# Patient Record
Sex: Female | Born: 2000 | Race: White | Hispanic: No | Marital: Single | Smoking: Never smoker
Health system: Southern US, Community
[De-identification: ages and names within clinical notes are randomized; demographics above are authoritative.]

## PROBLEM LIST (undated history)

## (undated) ENCOUNTER — Inpatient Hospital Stay (HOSPITAL_COMMUNITY): Payer: Self-pay

## (undated) DIAGNOSIS — F419 Anxiety disorder, unspecified: Secondary | ICD-10-CM

## (undated) DIAGNOSIS — E559 Vitamin D deficiency, unspecified: Secondary | ICD-10-CM

## (undated) DIAGNOSIS — F988 Other specified behavioral and emotional disorders with onset usually occurring in childhood and adolescence: Secondary | ICD-10-CM

## (undated) DIAGNOSIS — F32A Depression, unspecified: Secondary | ICD-10-CM

## (undated) DIAGNOSIS — K219 Gastro-esophageal reflux disease without esophagitis: Secondary | ICD-10-CM

## (undated) HISTORY — PX: NO PAST SURGERIES: SHX2092

## (undated) HISTORY — DX: Other specified behavioral and emotional disorders with onset usually occurring in childhood and adolescence: F98.8

## (undated) HISTORY — DX: Vitamin D deficiency, unspecified: E55.9

## (undated) HISTORY — DX: Gastro-esophageal reflux disease without esophagitis: K21.9

## (undated) HISTORY — PX: WISDOM TOOTH EXTRACTION: SHX21

## (undated) HISTORY — DX: Anxiety disorder, unspecified: F41.9

## (undated) HISTORY — DX: Depression, unspecified: F32.A

---
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
## 2022-01-31 ENCOUNTER — Other Ambulatory Visit: Payer: Medicaid Other

## 2022-01-31 ENCOUNTER — Ambulatory Visit: Admit: 2022-01-31 | Payer: Medicaid Other | Admitting: Obstetrics and Gynecology

## 2022-01-31 SURGERY — LAPAROSCOPY, DIAGNOSTIC
Anesthesia: Choice

## 2022-02-02 ENCOUNTER — Other Ambulatory Visit: Payer: Medicaid Other

## 2022-02-07 ENCOUNTER — Ambulatory Visit: Admission: RE | Admit: 2022-02-07 | Payer: Medicaid Other | Source: Ambulatory Visit

## 2022-02-10 ENCOUNTER — Ambulatory Visit: Payer: Medicaid Other

## 2022-02-10 ENCOUNTER — Ambulatory Visit: Payer: Medicaid Other | Admitting: Obstetrics & Gynecology

## 2022-02-14 ENCOUNTER — Ambulatory Visit: Payer: Medicaid Other | Admitting: Nurse Practitioner

## 2022-02-15 ENCOUNTER — Ambulatory Visit: Payer: Medicaid Other | Admitting: Nurse Practitioner

## 2022-02-16 ENCOUNTER — Ambulatory Visit: Payer: Medicaid Other

## 2022-03-07 ENCOUNTER — Ambulatory Visit: Payer: Medicaid Other | Admitting: Obstetrics & Gynecology

## 2022-03-13 ENCOUNTER — Ambulatory Visit: Payer: Medicaid Other

## 2022-03-14 ENCOUNTER — Ambulatory Visit: Payer: Medicaid Other | Admitting: Obstetrics and Gynecology

## 2022-03-16 ENCOUNTER — Ambulatory Visit (HOSPITAL_BASED_OUTPATIENT_CLINIC_OR_DEPARTMENT_OTHER)
Admission: RE | Admit: 2022-03-16 | Discharge: 2022-03-16 | Disposition: A | Discharge status: Home / Self Care | Payer: Medicaid Other | Source: Ambulatory Visit | Attending: Obstetrics and Gynecology | Admitting: Obstetrics and Gynecology

## 2022-03-16 DIAGNOSIS — N83202 Unspecified ovarian cyst, left side: Secondary | ICD-10-CM | POA: Insufficient documentation

## 2022-03-21 ENCOUNTER — Ambulatory Visit: Payer: Medicaid Other | Admitting: Obstetrics and Gynecology

## 2022-03-21 ENCOUNTER — Ambulatory Visit: Payer: Medicaid Other | Admitting: Nurse Practitioner

## 2022-03-23 ENCOUNTER — Ambulatory Visit: Payer: Medicaid Other | Admitting: Obstetrics and Gynecology

## 2022-03-27 ENCOUNTER — Ambulatory Visit: Payer: Medicaid Other | Admitting: Obstetrics and Gynecology

## 2022-03-27 ENCOUNTER — Ambulatory Visit (INDEPENDENT_AMBULATORY_CARE_PROVIDER_SITE_OTHER): Payer: Medicaid Other | Admitting: Obstetrics and Gynecology

## 2022-03-27 ENCOUNTER — Encounter: Payer: Self-pay | Admitting: Obstetrics and Gynecology

## 2022-03-27 VITALS — BP 116/78 | HR 82 | Resp 16 | Ht 65.0 in | Wt 207.0 lb

## 2022-03-27 DIAGNOSIS — N83202 Unspecified ovarian cyst, left side: Secondary | ICD-10-CM

## 2022-03-27 NOTE — Progress Notes (Signed)
   RETURN GYNECOLOGY VISIT  Subjective:  Anita Caldwell is a 22 y.o. G0 with LMP 02/28/2022 presenting for follow up of ovarian cysts.   First diagnosed 06/2021 - p/w abd/pelvic pain, [place] w/ 5.3 x 2.3 x 3.1cm complex cyst vs tuboovarian collection. Planned surveillance  Repeat US 2 weeks later w/ 4.5 x 4.3 x 4.2 complex cyst on R c/f hemorrhagic cyst and 4.5 x 3.4 x 4.3 cm complex cyst w/ internal thin septations. Seen by Dr. Damita Dunnings who offered surveillance vs surgical management.  Opted for surveillance  Seen again 8/3 - repeat US showed resolution of cyst on right, but interval increase in size of complex cyst on left w/ new possibility of hydrosalpinx & additional 2.9cm complex cyst c/f hemorrhagic cyst vs endometrioma. Was treated with doxycycline w/ plan for MRI & diagnostic laparoscopy.   MRI on 8/20 w/ 5.3 cm complex hemorrhagic cystic lesion in the left ovary and mild left hydrosalpinx, without significant change since previous ultrasound - ddx endometrioma, neoplasm. Also showed 1.0 cm hemorrhagic cyst or endometrioma in the right ovary. Was planned for dx lsc, ovarian cystectomy, and chromotubation, but saw Dr. Elly Modena for second opinion. Ultimately decided on surveillance and deferring surgery.   TODAY, she presents for [place] follow up. She reports occasional abdominal pain/cramping that she manages with OTC medications without issue. She had some pain with transvaginal component of [place] when performed. Pain is not affecting her day-to-day life. She desires fertility in the future but is not current TTC. Her pelvic US show a 3.1cm complex cyst in the left adnexa with no internal septations/nodules c/f hemorrhagic cyst. Her prior L sided cyst has resolved.   I personally reviewed the notes from Dr. Damita Dunnings on 07/05/21, 10/06/21 and 10/26/21, notes from Dr. Elly Modena on 12/27/21, as well as all of the imaging studies noted above.  Objective:   Vitals:   03/27/22 1105  BP: 116/78  Pulse: 82  Resp:  16  Weight: 207 lb (93.9 kg)  Height: 5\' 5"  (1.651 m)    General:  Alert, oriented and cooperative. Patient is in no acute distress.  Skin: Skin is warm and dry. No rash noted.   Cardiovascular: Normal heart rate noted  Respiratory: Normal respiratory effort, no problems with respiration noted   Assessment and Plan:  Anita Caldwell is a 22 y.o. with suspected left hemorrhagic cyst  Cyst of left ovary Discussed suspected hemorrhagic cyst vs endometrioma and that previously visualized cyst had resolved. Discussed etiology of hemorrhagic cyst. Given that she is asymptomatic, surgical intervention is not indicated at this time. Discussed that endometrioma can have an affect on fertility, but that further evaluation can be performed was she starts trying to conceive.  Discussed cyst suppression - reviewed theoretical benefit & options. She is considering and would like to f/u in the next few weeks to discuss contraception. Discussed that we can follow up cyst prn in 6-12 months   Return in about 2 months (around 05/26/2022).  Future Appointments  Date Time Provider [place]  05/08/2022  8:10 AM Inez Catalina, MD [facility]   Inez Catalina, MD

## 2022-05-08 ENCOUNTER — Ambulatory Visit: Payer: Medicaid Other | Admitting: Obstetrics and Gynecology

## 2022-05-10 ENCOUNTER — Ambulatory Visit (INDEPENDENT_AMBULATORY_CARE_PROVIDER_SITE_OTHER): Payer: Medicaid Other | Admitting: Nurse Practitioner

## 2022-05-10 ENCOUNTER — Encounter: Payer: Self-pay | Admitting: Nurse Practitioner

## 2022-05-10 VITALS — BP 109/67 | HR 77 | Ht 65.0 in | Wt 207.0 lb

## 2022-05-10 DIAGNOSIS — Z3201 Encounter for pregnancy test, result positive: Secondary | ICD-10-CM | POA: Diagnosis not present

## 2022-05-10 DIAGNOSIS — N926 Irregular menstruation, unspecified: Secondary | ICD-10-CM

## 2022-05-10 LAB — POCT URINE PREGNANCY: Preg Test, Ur: POSITIVE — AB

## 2022-05-10 NOTE — Progress Notes (Signed)
Established patient visit   Patient: Anita Caldwell   DOB: [birth year]   22 y.o. Female  MRN: [id number] Visit Date: 05/10/2022   Chief Complaint  Patient presents with   Amenorrhea   Subjective    HPI  Patient has missed per menstrual period.  -has taken several home pregnancy tests which have all been positive.  -she has had some nausea  -no other symptoms are noted at this time.     Medications: No outpatient medications prior to visit.   No facility-administered medications prior to visit.    Review of Systems See HPI    Last CBC Lab Results  Component Value Date   WBC 5.3 05/29/2022   HGB 13.4 05/29/2022   HCT 42.2 05/29/2022   MCV 84 05/29/2022   MCH 26.6 05/29/2022   RDW 13.6 05/29/2022   PLT 299 05/29/2022   Last metabolic panel Lab Results  Component Value Date   GLUCOSE 90 06/08/2021   NA 141 06/08/2021   K 4.3 06/08/2021   CL 107 (H) 06/08/2021   CO2 20 06/08/2021   BUN 12 06/08/2021   CREATININE 0.70 06/08/2021   EGFR 128 06/08/2021   CALCIUM 9.2 06/08/2021   PROT 6.8 04/11/2021   ALBUMIN 4.6 04/11/2021   LABGLOB 2.2 04/11/2021   AGRATIO 2.1 04/11/2021   BILITOT <0.2 04/11/2021   ALKPHOS 74 04/11/2021   AST 14 04/11/2021   ALT 14 04/11/2021   Last lipids Lab Results  Component Value Date   CHOL 186 (H) 04/11/2021   HDL 52 04/11/2021   LDLCALC 118 (H) 04/11/2021   TRIG 85 04/11/2021   CHOLHDL 3.6 04/11/2021   Last hemoglobin A1c Lab Results  Component Value Date   HGBA1C 5.3 04/11/2021   Last thyroid functions Lab Results  Component Value Date   TSH 1.640 04/11/2021   Last vitamin D Lab Results  Component Value Date   VD25OH 21.5 (L) 04/11/2021       Objective     Today's Vitals   05/10/22 1441  BP: 109/67  Pulse: 77  SpO2: 99%  Weight: 207 lb (93.9 kg)  Height:  (1.651 m)   Body mass index is 34.45 kg/m.  BP Readings from Last 3 Encounters:  05/23/22 113/75  05/10/22 109/67  03/27/22 116/78    Wt  Readings from Last 3 Encounters:  05/23/22 210 lb (95.3 kg)  05/10/22 207 lb (93.9 kg)  03/27/22 207 lb (93.9 kg) (98 %, Z= 2.04)*   * Growth percentiles are based on CDC (Girls, 2-20 Years) data.    Physical Exam Vitals and nursing note reviewed.  Constitutional:      Appearance: Normal appearance. She is well-developed.  HENT:     Head: Normocephalic and atraumatic.     Nose: Nose normal.     Mouth/Throat:     Mouth: Mucous membranes are moist.     Pharynx: Oropharynx is clear.  Eyes:     Extraocular Movements: Extraocular movements intact.     Conjunctiva/sclera: Conjunctivae normal.     Pupils: Pupils are equal, round, and reactive to light.  Neck:     Vascular: No carotid bruit.  Cardiovascular:     Rate and Rhythm: Normal rate and regular rhythm.     Pulses: Normal pulses.     Heart sounds: Normal heart sounds.  Pulmonary:     Effort: Pulmonary effort is normal.     Breath sounds: Normal breath sounds.  Abdominal:     Palpations: Abdomen is  soft.  Genitourinary:    Comments: Urine pregnancy test  is positive today  Musculoskeletal:        General: Normal range of motion.     Cervical back: Normal range of motion and neck supple.  Lymphadenopathy:     Cervical: No cervical adenopathy.  Skin:    General: Skin is warm and dry.     Capillary Refill: Capillary refill takes less than 2 seconds.  Neurological:     General: No focal deficit present.     Mental Status: She is alert and oriented to person, place, and time.  Psychiatric:        Mood and Affect: Mood normal.        Behavior: Behavior normal.        Thought Content: Thought content normal.        Judgment: Judgment normal.     Results for orders placed or performed in visit on 05/10/22  POCT urine pregnancy  Result Value Ref Range   Preg Test, Ur Positive (A) Negative    Assessment & Plan     Problem List Items Addressed This Visit       Other   Irregular periods - Primary    Positive  pregnancy test today. She will follow up with her OB/GYN provider as scheduled.       Relevant Orders   POCT urine pregnancy (Completed)   Positive pregnancy test    Positive pregnancy test today. She will follow up with her OB/GYN provider as scheduled.         Return for prn worsening or persistent symptoms.         Carlean Jews, NP  [facility] at [facility] [phone] (phone) [phone] (fax)  [facility]

## 2022-05-18 ENCOUNTER — Ambulatory Visit (INDEPENDENT_AMBULATORY_CARE_PROVIDER_SITE_OTHER): Payer: Medicaid Other

## 2022-05-18 ENCOUNTER — Other Ambulatory Visit: Payer: Self-pay

## 2022-05-18 ENCOUNTER — Telehealth: Payer: Self-pay

## 2022-05-18 DIAGNOSIS — O26859 Spotting complicating pregnancy, unspecified trimester: Secondary | ICD-10-CM

## 2022-05-18 DIAGNOSIS — Z3A00 Weeks of gestation of pregnancy not specified: Secondary | ICD-10-CM | POA: Diagnosis not present

## 2022-05-18 NOTE — Progress Notes (Signed)
U/S ordered per Dr.Duncan

## 2022-05-18 NOTE — Telephone Encounter (Signed)
Pt has NOB scheduled for 3/19. Starting today pt has had light pink/brown spotting when she wipes. Pt has mild cramping that comes and goes. The cramping has been happening for a couple of weeks. Pt denies recent intercourse. Per Dr.Duncan an U/S was placed in imaging dept. Pt was told to go to [facility] if bleeding and/or pain increases. Pt call then transferred to imaging dept to schedule U/S.

## 2022-05-19 ENCOUNTER — Other Ambulatory Visit (INDEPENDENT_AMBULATORY_CARE_PROVIDER_SITE_OTHER): Payer: Medicaid Other

## 2022-05-19 ENCOUNTER — Telehealth: Payer: Self-pay

## 2022-05-19 DIAGNOSIS — O200 Threatened abortion: Secondary | ICD-10-CM

## 2022-05-19 DIAGNOSIS — Z3A01 Less than 8 weeks gestation of pregnancy: Secondary | ICD-10-CM

## 2022-05-19 DIAGNOSIS — O3680X0 Pregnancy with inconclusive fetal viability, not applicable or unspecified: Secondary | ICD-10-CM

## 2022-05-19 NOTE — Progress Notes (Signed)
Pt here for BHCG after U/S results. Pt given lab and sent to lab. Pt will return on Monday for STAT BHCG.

## 2022-05-19 NOTE — Telephone Encounter (Signed)
Pt returned my call. I told pt U/S results based off of Dr.Duncan's MyChart message to pt. Per Dr.Duncan pt should come today for BHCG and repeat STAT BHCG on Monday. Pt was scheduled for both blood draws. Pt's NOB appt for 3/19 changed to a GYN appt to discuss ultrasound due to results. Pt expressed understanding of results and plan of care.

## 2022-05-19 NOTE — Telephone Encounter (Addendum)
Attempted to call pt to have her come in for BHCG. Pt did not answer. Voicemail left asking pt to call office as soon as she can.  ----- Message from Radene Gunning, MD sent at 05/19/2022  8:28 AM EDT ----- Anita Caldwell - she needs to have serial beta HCGs. One today if possible and one on Monday - the one on Monday would ideally be stat and first thing in the morning. I would order them under whoever is seeing her for new OB and change her New OB visit to a Pregnancy unknown location visit.

## 2022-05-19 NOTE — Addendum Note (Signed)
Addended by: Lyndal Rainbow on: 05/19/2022 09:05 AM   Modules accepted: Orders

## 2022-05-20 LAB — BETA HCG QUANT (REF LAB): hCG Quant: 826 m[IU]/mL

## 2022-05-22 ENCOUNTER — Other Ambulatory Visit (INDEPENDENT_AMBULATORY_CARE_PROVIDER_SITE_OTHER): Payer: Medicaid Other

## 2022-05-22 DIAGNOSIS — O3680X0 Pregnancy with inconclusive fetal viability, not applicable or unspecified: Secondary | ICD-10-CM

## 2022-05-22 DIAGNOSIS — O021 Missed abortion: Secondary | ICD-10-CM

## 2022-05-22 DIAGNOSIS — Z3A01 Less than 8 weeks gestation of pregnancy: Secondary | ICD-10-CM

## 2022-05-22 LAB — BETA HCG QUANT (REF LAB): hCG Quant: 265 m[IU]/mL

## 2022-05-22 NOTE — Progress Notes (Signed)
Pt given order for stat bhcg lab and sent to lab.

## 2022-05-23 ENCOUNTER — Ambulatory Visit: Payer: Medicaid Other | Admitting: Certified Nurse Midwife

## 2022-05-23 ENCOUNTER — Encounter: Payer: Self-pay | Admitting: Certified Nurse Midwife

## 2022-05-23 VITALS — BP 113/75 | HR 95 | Resp 16 | Ht 65.0 in | Wt 210.0 lb

## 2022-05-23 DIAGNOSIS — Z3A01 Less than 8 weeks gestation of pregnancy: Secondary | ICD-10-CM

## 2022-05-23 DIAGNOSIS — O039 Complete or unspecified spontaneous abortion without complication: Secondary | ICD-10-CM | POA: Diagnosis not present

## 2022-05-23 NOTE — Progress Notes (Signed)
   GYNECOLOGY OFFICE VISIT NOTE  History:  22 y.o. G2P0010 here today for follow up after [place] and HCG. Reports LMP was end of January but it wasn't as heavy or as long as usual. No period in February so she took a pregnancy test and was positive. Started having some VB last week so she called the office and had [place] and HCG. [place] on 05/18/22 showed no IUP or adnexal mass. Qhcg on 05/19/22 was 826. Repeat qhcg on 05/22/22 was 265. She continue to have a small amount of VB. The pregnancy wasn't planned but she was happy, fiance and family is supportive. Long standing hx of ovarian cysts and she wasn't sure she would be able to become pregnant.  Past Medical History:  Diagnosis Date   Anxiety     History reviewed. No pertinent surgical history.  History reviewed. No pertinent family history.  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   Health Maintenance:  Pap: n/a Review of  Systems:  Negative except noted in HPI  Objective:  Physical Exam BP 113/75   Pulse 95   Resp 16   Ht 5\' 5"  (1.651 m)   Wt 210 lb (95.3 kg)   LMP 04/01/2022 (Exact Date)   Breastfeeding No   BMI 34.95 kg/m  CONSTITUTIONAL: Well-developed, well-nourished female in no acute distress.  HENT:  Normocephalic, atraumatic EYES: Conjunctivae and EOM are normal NECK: Normal range of motion NEUROLOGIC: Alert and oriented to person, place, and time PSYCHIATRIC: Normal mood and affect CARDIOVASCULAR: Normal heart rate noted RESPIRATORY: Effort and rate normal MUSCULOSKELETAL: Normal range of motion  Labs and Imaging No results found for this or any previous visit (from the past 24 hour(s)).  Assessment & Plan:   1. SAB (spontaneous abortion)    CBC/ABO: evaluate need for Rhogam (although its been a week since bleeding started) Weekly quants until neg Pelvic rest Discussed family planning, she declines contraception and may use condoms   I spent 20 minutes dedicated to the care of this patient including pre-visit review of records, face to face time with the patient discussing her conditions and treatments and post visit ordering of testing.  Julianne Handler, CNM 05/23/2022 4:25 PM

## 2022-05-24 ENCOUNTER — Ambulatory Visit: Payer: Medicaid Other | Admitting: Obstetrics and Gynecology

## 2022-05-29 ENCOUNTER — Other Ambulatory Visit: Payer: Medicaid Other

## 2022-05-29 DIAGNOSIS — O039 Complete or unspecified spontaneous abortion without complication: Secondary | ICD-10-CM

## 2022-05-29 NOTE — Progress Notes (Signed)
Pt here for repeat BHCG ABO/Rh due to SAB

## 2022-05-30 LAB — CBC
Hematocrit: 42.2 % (ref 34.0–46.6)
Hemoglobin: 13.4 g/dL (ref 11.1–15.9)
MCH: 26.6 pg (ref 26.6–33.0)
MCHC: 31.8 g/dL (ref 31.5–35.7)
MCV: 84 fL (ref 79–97)
Platelets: 299 10*3/uL (ref 150–450)
RBC: 5.04 x10E6/uL (ref 3.77–5.28)
RDW: 13.6 % (ref 11.7–15.4)
WBC: 5.3 10*3/uL (ref 3.4–10.8)

## 2022-05-30 LAB — BETA HCG QUANT (REF LAB): hCG Quant: 7 m[IU]/mL

## 2022-05-30 LAB — ABO AND RH: Rh Factor: POSITIVE

## 2022-06-18 DIAGNOSIS — Z3201 Encounter for pregnancy test, result positive: Secondary | ICD-10-CM | POA: Insufficient documentation

## 2022-06-18 NOTE — Assessment & Plan Note (Signed)
Positive pregnancy test today. She will follow up with her OB/GYN provider as scheduled.  

## 2022-06-18 NOTE — Assessment & Plan Note (Signed)
Positive pregnancy test today. She will follow up with her OB/GYN provider as scheduled.

## 2022-07-06 ENCOUNTER — Ambulatory Visit (INDEPENDENT_AMBULATORY_CARE_PROVIDER_SITE_OTHER): Payer: Medicaid Other | Admitting: Family Medicine

## 2022-07-06 ENCOUNTER — Encounter: Payer: Self-pay | Admitting: Family Medicine

## 2022-07-06 VITALS — BP 114/76 | HR 88 | Temp 97.6°F | Resp 18 | Ht 65.0 in | Wt 211.0 lb

## 2022-07-06 DIAGNOSIS — R0981 Nasal congestion: Secondary | ICD-10-CM

## 2022-07-06 DIAGNOSIS — G43009 Migraine without aura, not intractable, without status migrainosus: Secondary | ICD-10-CM | POA: Diagnosis not present

## 2022-07-06 DIAGNOSIS — J069 Acute upper respiratory infection, unspecified: Secondary | ICD-10-CM

## 2022-07-06 MED ORDER — SUMATRIPTAN SUCCINATE 25 MG PO TABS: 25.0000 mg | ORAL_TABLET | Freq: Once | ORAL | 0 refills | Status: DC

## 2022-07-06 MED ORDER — FLUTICASONE PROPIONATE 50 MCG/ACT NA SUSP: 2.0000 | Freq: Every day | NASAL | 0 refills | Status: DC

## 2022-07-06 NOTE — Assessment & Plan Note (Signed)
Migraine not relieved by Tylenol or Excedrin Migraine.  Trial of sumatriptan 25 mg.  In the past, sample of Nurtec was helpful, but insurance would not cover it.

## 2022-07-06 NOTE — Patient Instructions (Addendum)
Continue taking Mucinex twice a day and drinking TONS of water. Try the sumatriptan for your headache. You may also use the Flonase spray 2 sprays per nostril twice a day.  At this point in time, your symptoms are most likely caused by a virus.  If your symptoms are not getting better by Tuesday of next week, send me a message on MyChart.  I have also included some instructions on how to do a sinus rinse.  It may sound weird, but I have had a lot of people that find that it can be very helpful if you would like to try it!

## 2022-07-06 NOTE — Progress Notes (Signed)
Acute Office Visit  Subjective:     Patient ID: Anita Caldwell, female    DOB: 2001-01-07, 22 y.o.   MRN: [id number]  Chief Complaint  Patient presents with   URI   Nasal Congestion   Cough    unproductive   Ear Fullness    left    HPI Patient is in today for nasal congestion, dry cough, and fullness of her left ear. Upper Respiratory Infection: Patient complains of symptoms of a URI. Symptoms include left ear drainage , congestion, cough, and sore throat. Onset of symptoms was 4 days ago, gradually worsening since that time. She also c/o headache described as waxing and waning sharp pain of her left temple similar to chronic migraines, nasal congestion, non productive cough, and post nasal drip for the past 4 days .  She is drinking plenty of fluids, but she does endorse some dry mouth. Evaluation to date: none. Treatment to date:  She has been taking Mucinex and trying Vicks nasal spray.  She also tried NyQuil to help her sleep but did not find that it was very helpful.  Her headache has not been relieved by Tylenol or Excedrin Migraine.  Review of Systems  Constitutional:  Negative for chills, diaphoresis, fever and malaise/fatigue.  HENT:  Positive for congestion (Nasal/sinus) and sore throat. Negative for ear discharge, ear pain and hearing loss.        Left ear fullness  Eyes:  Negative for blurred vision, double vision and photophobia.  Respiratory:  Positive for cough. Negative for hemoptysis, sputum production and shortness of breath.   Cardiovascular:  Negative for chest pain and palpitations.  Gastrointestinal:  Negative for abdominal pain, constipation, diarrhea, nausea and vomiting.  Skin:  Negative for rash.  Neurological:  Positive for headaches.     Objective:    BP 114/76 (BP Location: Left Arm, Patient Position: Sitting, Cuff Size: Normal)   Pulse 88   Temp 97.6 F (36.4 C) (Temporal)   Resp 18   Ht 5\' 5"  (1.651 m)   Wt 211 lb (95.7 kg)   LMP 04/01/2022  (Exact Date)   SpO2 98%   BMI 35.11 kg/m   Physical Exam Constitutional:      General: She is not in acute distress.    Appearance: Normal appearance. She is not ill-appearing.  HENT:     Head: Normocephalic and atraumatic.     Right Ear: Tympanic membrane, ear canal and external ear normal. Tympanic membrane is not injected, scarred, perforated, erythematous, retracted or bulging.     Left Ear: Tympanic membrane, ear canal and external ear normal. Tympanic membrane is not injected, scarred, perforated, erythematous, retracted or bulging.     Ears:     Comments: No fluid collection visualized on either side    Nose: Rhinorrhea present. No congestion.     Mouth/Throat:     Mouth: Mucous membranes are moist.     Pharynx: Oropharynx is clear. Posterior oropharyngeal erythema present. No oropharyngeal exudate.  Eyes:     General:        Right eye: No discharge.        Left eye: No discharge.     Extraocular Movements: Extraocular movements intact.     Conjunctiva/sclera: Conjunctivae normal.     Pupils: Pupils are equal, round, and reactive to light.  Cardiovascular:     Rate and Rhythm: Regular rhythm.     Heart sounds: No murmur heard.    No friction rub. No gallop.  Pulmonary:     Effort: Pulmonary effort is normal. No respiratory distress.     Breath sounds: No wheezing, rhonchi or rales.  Musculoskeletal:     Cervical back: Normal range of motion. No tenderness.  Lymphadenopathy:     Cervical: No cervical adenopathy.  Neurological:     Mental Status: She is alert and oriented to person, place, and time.      Assessment & Plan:  Viral URI with cough  Migraine without aura and without status migrainosus, not intractable Assessment & Plan: Migraine not relieved by Tylenol or Excedrin Migraine.  Trial of sumatriptan 25 mg.  In the past, sample of Nurtec was helpful, but insurance would not cover it.  Orders: -     SUMAtriptan Succinate; Take 1 tablet (25 mg total) by  mouth once for 1 dose. May repeat in 2 hours if headache persists or recurs.  Dispense: 10 tablet; Refill: 0  Nasal congestion -     Fluticasone Propionate; Place 2 sprays into both nostrils daily.  Dispense: 16 g; Refill: 0  Viral URI We discussed that given that symptoms have only been ongoing for about 4 days, it is most likely viral in etiology.  If symptoms do not improve by 7 to 10 days (next Tuesday to Friday), patient will send a message on MyChart and we can discuss possibility of needing antibiotics to cover for viral infection.  Recommended to continue Mucinex and emphasized the importance of adequate hydration.  Starting Flonase spray, 2 sprays per nostril twice a day for congestion.  We are going to trial sumatriptan for her headache.  I also provided written instructions on how to do a sinus rinse as she may find that helpful to relieve congestion.  Return if symptoms worsen or fail to improve.  I spent 20 minutes on the day of the encounter to include pre-visit record review, face-to-face time with the patient providing education and counseling.  Melida Quitter, PA

## 2022-07-26 ENCOUNTER — Ambulatory Visit: Payer: Medicaid Other | Admitting: Nurse Practitioner

## 2022-08-08 ENCOUNTER — Encounter: Payer: Self-pay | Admitting: Nurse Practitioner

## 2022-08-08 ENCOUNTER — Ambulatory Visit (INDEPENDENT_AMBULATORY_CARE_PROVIDER_SITE_OTHER): Payer: Medicaid Other | Admitting: Nurse Practitioner

## 2022-08-08 VITALS — BP 106/74 | HR 93 | Ht 65.0 in | Wt 203.4 lb

## 2022-08-08 DIAGNOSIS — F410 Panic disorder [episodic paroxysmal anxiety] without agoraphobia: Secondary | ICD-10-CM | POA: Diagnosis not present

## 2022-08-08 DIAGNOSIS — F411 Generalized anxiety disorder: Secondary | ICD-10-CM | POA: Diagnosis not present

## 2022-08-08 MED ORDER — HYDROXYZINE HCL 10 MG PO TABS: 10.0000 mg | ORAL_TABLET | Freq: Three times a day (TID) | ORAL | 1 refills | Status: DC | PRN

## 2022-08-08 MED ORDER — FLUOXETINE HCL 10 MG PO CAPS: 10.0000 mg | ORAL_CAPSULE | Freq: Every day | ORAL | 0 refills | Status: DC

## 2022-08-08 NOTE — Progress Notes (Signed)
Established patient visit   Patient: Anita Caldwell   DOB: 11-07-2000   22 y.o. Female  MRN: [id number] Visit Date: 08/08/2022   Chief Complaint  Patient presents with   Follow-up   Subjective    HPI  Struggling with depression and anxiety -now talking to therapist.  -was on wellbutrin for a little while.  -stopped taking this because it made her feel "loopy."  -was pregnant in March. Has since had miscarriage.  --states that this is making this worse  -Excess worry -gets herself worked up -gets short of breath, can't breathe. Gets chest pain or massive migraine.  -concern about weight gain.  --leads to increased anxiety/worry    Medications: Outpatient Medications Prior to Visit  Medication Sig   fluticasone (FLONASE) 50 MCG/ACT nasal spray Place 2 sprays into both nostrils daily.   SUMAtriptan (IMITREX) 25 MG tablet Take 1 tablet (25 mg total) by mouth once for 1 dose. May repeat in 2 hours if headache persists or recurs.   No facility-administered medications prior to visit.    Review of Systems See HPI      Objective     Today's Vitals   08/08/22 1103  BP: 106/74  Pulse: 93  SpO2: 98%  Weight: 203 lb 6.4 oz (92.3 kg)  Height: 5\' 5"  (1.651 m)   Body mass index is 33.85 kg/m.  BP Readings from Last 3 Encounters:  08/08/22 106/74  07/06/22 114/76  05/23/22 113/75    Wt Readings from Last 3 Encounters:  08/08/22 203 lb 6.4 oz (92.3 kg)  07/06/22 211 lb (95.7 kg)  05/23/22 210 lb (95.3 kg)    Physical Exam Vitals and nursing note reviewed.  Constitutional:      Appearance: Normal appearance. She is well-developed.  HENT:     Head: Normocephalic and atraumatic.     Nose: Nose normal.     Mouth/Throat:     Mouth: Mucous membranes are moist.     Pharynx: Oropharynx is clear.  Eyes:     Extraocular Movements: Extraocular movements intact.     Conjunctiva/sclera: Conjunctivae normal.     Pupils: Pupils are equal, round, and reactive to light.  Neck:      Vascular: No carotid bruit.  Cardiovascular:     Rate and Rhythm: Normal rate and regular rhythm.     Pulses: Normal pulses.     Heart sounds: Normal heart sounds.  Pulmonary:     Effort: Pulmonary effort is normal.     Breath sounds: Normal breath sounds.  Abdominal:     Palpations: Abdomen is soft.  Musculoskeletal:        General: Normal range of motion.     Cervical back: Normal range of motion and neck supple.  Lymphadenopathy:     Cervical: No cervical adenopathy.  Skin:    General: Skin is warm and dry.     Capillary Refill: Capillary refill takes less than 2 seconds.  Neurological:     General: No focal deficit present.     Mental Status: She is alert and oriented to person, place, and time.  Psychiatric:        Attention and Perception: Attention and perception normal.        Mood and Affect: Mood is anxious and depressed.        Speech: Speech normal.        Behavior: Behavior normal. Behavior is cooperative.        Thought Content: Thought content normal.  Cognition and Memory: Cognition normal.        Judgment: Judgment normal.      Assessment & Plan    Generalized anxiety disorder Assessment & Plan: Trial fluoxetine 10 mg tablets daily. -Continue regular visits with therapist as scheduled. -Reassess in 4 weeks.  Orders: -     FLUoxetine HCl; Take 1 capsule (10 mg total) by mouth daily.  Dispense: 90 capsule; Refill: 0  Panic attack Assessment & Plan: Trial hydroxyzine 10 mg. -Patient may take 5 to 10 mg up to 3 times daily as needed for acute anxiety -Continue regular visits with therapist as scheduled. -Reassess in 4 weeks.  Orders: -     hydrOXYzine HCl; Take 1 tablet (10 mg total) by mouth 3 (three) times daily as needed.  Dispense: 30 tablet; Refill: 1     Return in about 4 weeks (around 09/05/2022) for GAD  - added prozac - maybe before i leave if possible .         Carlean Jews, NP  [facility] at [facility] [phone] (phone) [phone] (fax)  [facility]

## 2022-08-27 DIAGNOSIS — F411 Generalized anxiety disorder: Secondary | ICD-10-CM | POA: Insufficient documentation

## 2022-08-27 DIAGNOSIS — F410 Panic disorder [episodic paroxysmal anxiety] without agoraphobia: Secondary | ICD-10-CM | POA: Insufficient documentation

## 2022-08-27 NOTE — Assessment & Plan Note (Addendum)
Trial fluoxetine 10 mg tablets daily. -Continue regular visits with therapist as scheduled. -Reassess in 4 weeks.

## 2022-08-27 NOTE — Assessment & Plan Note (Signed)
Trial hydroxyzine 10 mg. -Patient may take 5 to 10 mg up to 3 times daily as needed for acute anxiety -Continue regular visits with therapist as scheduled. -Reassess in 4 weeks.

## 2022-08-28 ENCOUNTER — Encounter: Payer: Self-pay | Admitting: Obstetrics and Gynecology

## 2022-08-28 ENCOUNTER — Other Ambulatory Visit (HOSPITAL_COMMUNITY)
Admission: RE | Admit: 2022-08-28 | Discharge: 2022-08-28 | Disposition: A | Discharge status: Home / Self Care | Payer: Medicaid Other | Source: Ambulatory Visit | Attending: Obstetrics and Gynecology | Admitting: Obstetrics and Gynecology

## 2022-08-28 ENCOUNTER — Ambulatory Visit (INDEPENDENT_AMBULATORY_CARE_PROVIDER_SITE_OTHER): Payer: Medicaid Other | Admitting: Obstetrics and Gynecology

## 2022-08-28 VITALS — BP 122/75 | HR 75 | Resp 16 | Ht 65.0 in | Wt 209.0 lb

## 2022-08-28 DIAGNOSIS — Z113 Encounter for screening for infections with a predominantly sexual mode of transmission: Secondary | ICD-10-CM

## 2022-08-28 DIAGNOSIS — N946 Dysmenorrhea, unspecified: Secondary | ICD-10-CM | POA: Diagnosis not present

## 2022-08-28 DIAGNOSIS — Z8742 Personal history of other diseases of the female genital tract: Secondary | ICD-10-CM

## 2022-08-28 DIAGNOSIS — Z3200 Encounter for pregnancy test, result unknown: Secondary | ICD-10-CM

## 2022-08-28 DIAGNOSIS — Z3202 Encounter for pregnancy test, result negative: Secondary | ICD-10-CM | POA: Diagnosis not present

## 2022-08-28 LAB — POCT URINE PREGNANCY: Preg Test, Ur: NEGATIVE

## 2022-08-28 MED ORDER — JUNEL FE 24 1-20 MG-MCG(24) PO TABS: 1.0000 | ORAL_TABLET | Freq: Every day | ORAL | 3 refills | Status: DC

## 2022-08-28 NOTE — Patient Instructions (Signed)
Your pregnancy test today was negative. To be certain you aren't pregnant, please take another test at home in 2-4 weeks.   You will see your gonorrhea/chlamydia test results in the MyChart app in the next few days.

## 2022-08-28 NOTE — Progress Notes (Signed)
   RETURN GYNECOLOGY VISIT  Subjective:  Anita Caldwell is a 22 y.o. G1P0010 with LMP 08/19/22 presenting for follow up of ovarian cysts.   See my note from 03/27/22 which outlines in detail the surveillance the patient had undergone complex cysts over the course of the year. When I evaluated the patient 03/27/22 her most recent ultrasound show resolution of a 5.6cm complex cyst in the L adnexa, but a new 3.1cm complex cystic structure within the same ovary with the appearance of a hemorrhagic cyst. Has previously had concerns for endometrioma on prior imaging. She was asymptomatic, so we planned for surveillance.   Since that appt, the patient had an SAB. An US obtained on 05/15/22 showed a small hemorrhagic corpus luteum in the left ovary and a normal right ovary.   Today, Anita Caldwell reports that she is doing well overall. Since her miscarriage her periods are heavier and cycle length varies more. No HVB. Has left sided pain with her periods and sometimes outside of her period. Pain is manageable. Denies dyspareunia.   I personally reviewed the read and images from the patients pelvic ultrasounds on 03/16/22 and 05/18/22 and agree with the findings outlined by the radiologist.   Objective:   Vitals:   08/28/22 1537  BP: 122/75  Pulse: 75  Resp: 16  Weight: 209 lb (94.8 kg)  Height: 5\' 5"  (1.651 m)   General:  Alert, oriented and cooperative. Patient is in no acute distress.  Skin: Skin is warm and dry. No rash noted.   Cardiovascular: Normal heart rate noted  Respiratory: Normal respiratory effort, no problems with respiration noted  Abdomen: Soft, non-tender, non-distended    Assessment and Plan:  Anita Caldwell is a 22 y.o. with history of ovarian cysts, dysmenorrhea  History of ovarian cyst Dysmenorrhea - Given her overall normal ultrasound on 05/18/22, would not recommend follow up imaging at this time. Discussed that what we previously seen may have been physiologic hemorrhagic cysts  versus endometriomas. Given that the cysts have resolved, extremely low likelihood of an ovarian malignancy/neoplasm.  - Discussed COCs as an option for management of dysmenorrhea and heavier cycles. We discussed COCs can also help with endometriosis if that is contributing and in theory can suppress cyst formation. She has no contraindications to estrogen. -     POCT urine pregnancy -     Norethindrone Acetate-Ethinyl Estrad-FE (JUNEL FE 24) 1-20 MG-MCG(24) tablet; Take 1 tablet by mouth daily.  History of SAB Discussion today about likelihood of successful future pregnancies, testing for fertility, etc. Would not recommend any specific work up at this time. Discussed that endometriosis can only be definitively diagnosed with surgery which she prefers to avoid   Screening examination for STI -     GC/Chlamydia probe amp ([place])not at [facility]  Return in about 8 months (around 04/30/2023) for annual exam with pap .  Future Appointments  Date Time Provider Department Center  09/01/2022  9:50 AM Carlean Jews, NP [facility] None   Lennart Pall, MD

## 2022-08-29 LAB — GC/CHLAMYDIA PROBE AMP (~~LOC~~) NOT AT ARMC
Chlamydia: NEGATIVE
Comment: NEGATIVE
Comment: NORMAL
Neisseria Gonorrhea: NEGATIVE

## 2022-09-01 ENCOUNTER — Encounter: Payer: Self-pay | Admitting: Nurse Practitioner

## 2022-09-01 ENCOUNTER — Ambulatory Visit (INDEPENDENT_AMBULATORY_CARE_PROVIDER_SITE_OTHER): Payer: Medicaid Other | Admitting: Nurse Practitioner

## 2022-09-01 VITALS — BP 99/65 | HR 69 | Ht 65.0 in | Wt 206.8 lb

## 2022-09-01 DIAGNOSIS — Z6834 Body mass index (BMI) 34.0-34.9, adult: Secondary | ICD-10-CM

## 2022-09-01 DIAGNOSIS — F411 Generalized anxiety disorder: Secondary | ICD-10-CM | POA: Diagnosis not present

## 2022-09-01 MED ORDER — FLUOXETINE HCL 10 MG PO CAPS: 10.0000 mg | ORAL_CAPSULE | Freq: Every day | ORAL | 1 refills | Status: DC

## 2022-09-01 NOTE — Progress Notes (Unsigned)
Established patient visit   Patient: Anita Caldwell   DOB: 14-Jul-2000   22 y.o. Female  MRN: [id number] Visit Date: 09/01/2022   Chief Complaint  Patient presents with   Medical Management of Chronic Issues   Subjective    HPI  Follow up  -GAD --started fluoxetine 10 mg at last visit  -improved symptoms  -sleeping ok.  -eating ok  Denies new problems or concerns.  -has lost 3 pounds since her last visit -walking most evenings over a mile.  -No new concerns or complaints today. -She denies chest pain, chest pressure, or shortness of breath. She denies headaches or visual disturbances. She denies abdominal pain, nausea, vomiting, or changes in bowel or bladder habits.      Medications: Outpatient Medications Prior to Visit  Medication Sig   Norethindrone Acetate-Ethinyl Estrad-FE (JUNEL FE 24) 1-20 MG-MCG(24) tablet Take 1 tablet by mouth daily.   [DISCONTINUED] FLUoxetine (PROZAC) 10 MG capsule Take 1 capsule (10 mg total) by mouth daily.   fluticasone (FLONASE) 50 MCG/ACT nasal spray Place 2 sprays into both nostrils daily.   SUMAtriptan (IMITREX) 25 MG tablet Take 1 tablet (25 mg total) by mouth once for 1 dose. May repeat in 2 hours if headache persists or recurs.   [DISCONTINUED] hydrOXYzine (ATARAX) 10 MG tablet Take 1 tablet (10 mg total) by mouth 3 (three) times daily as needed. (Patient not taking: Reported on 08/28/2022)   No facility-administered medications prior to visit.    Review of Systems See HPI      Objective     Today's Vitals   09/01/22 0956  BP: 99/65  Pulse: 69  SpO2: 99%  Weight: 206 lb 12.8 oz (93.8 kg)  Height: 5\' 5"  (1.651 m)   Body mass index is 34.41 kg/m.  BP Readings from Last 3 Encounters:  09/01/22 99/65  08/28/22 122/75  08/08/22 106/74    Wt Readings from Last 3 Encounters:  09/01/22 206 lb 12.8 oz (93.8 kg)  08/28/22 209 lb (94.8 kg)  08/08/22 203 lb 6.4 oz (92.3 kg)    Physical Exam Vitals and nursing note reviewed.   Constitutional:      Appearance: Normal appearance. She is well-developed.  HENT:     Head: Normocephalic and atraumatic.     Nose: Nose normal.     Mouth/Throat:     Mouth: Mucous membranes are moist.     Pharynx: Oropharynx is clear.  Eyes:     Extraocular Movements: Extraocular movements intact.     Conjunctiva/sclera: Conjunctivae normal.     Pupils: Pupils are equal, round, and reactive to light.  Neck:     Vascular: No carotid bruit.  Cardiovascular:     Rate and Rhythm: Normal rate and regular rhythm.     Pulses: Normal pulses.     Heart sounds: Normal heart sounds.  Pulmonary:     Effort: Pulmonary effort is normal.     Breath sounds: Normal breath sounds.  Abdominal:     Palpations: Abdomen is soft.  Musculoskeletal:        General: Normal range of motion.     Cervical back: Normal range of motion and neck supple.  Lymphadenopathy:     Cervical: No cervical adenopathy.  Skin:    General: Skin is warm and dry.     Capillary Refill: Capillary refill takes less than 2 seconds.  Neurological:     General: No focal deficit present.     Mental Status: She is alert and oriented  to person, place, and time.  Psychiatric:        Mood and Affect: Mood normal.        Behavior: Behavior normal.        Thought Content: Thought content normal.        Judgment: Judgment normal.      Assessment & Plan    Generalized anxiety disorder Assessment & Plan: Improved and stable.  Continue Prozac as prescribed. Reassess in 4 months, sooner if needed.  Orders: -     FLUoxetine HCl; Take 1 capsule (10 mg total) by mouth daily.  Dispense: 90 capsule; Refill: 1  BMI 34.0-34.9,adult Assessment & Plan: Discussed lowering calorie intake to 1500 calories per day and incorporating exercise into daily routine to help lose weight.       Return in about 4 months (around 01/01/2023) for mood.         Carlean Jews, NP  [facility] at [facility] [phone]  (phone) [phone] (fax)  [facility]

## 2022-09-04 NOTE — Assessment & Plan Note (Signed)
Discussed lowering calorie intake to 1500 calories per day and incorporating exercise into daily routine to help lose weight.  °

## 2022-09-04 NOTE — Assessment & Plan Note (Signed)
Improved and stable.  Continue Prozac as prescribed. Reassess in 4 months, sooner if needed.

## 2022-10-18 ENCOUNTER — Ambulatory Visit: Payer: Medicaid Other | Admitting: Family Medicine

## 2023-01-02 ENCOUNTER — Ambulatory Visit: Payer: Medicaid Other | Admitting: Family Medicine

## 2023-01-02 ENCOUNTER — Ambulatory Visit (INDEPENDENT_AMBULATORY_CARE_PROVIDER_SITE_OTHER): Payer: BC Managed Care – PPO | Admitting: Family Medicine

## 2023-01-02 ENCOUNTER — Encounter: Payer: Self-pay | Admitting: Family Medicine

## 2023-01-02 VITALS — BP 113/70 | HR 81 | Resp 18 | Ht 65.0 in | Wt 209.0 lb

## 2023-01-02 DIAGNOSIS — F5081 Binge eating disorder, mild: Secondary | ICD-10-CM | POA: Diagnosis not present

## 2023-01-02 DIAGNOSIS — F988 Other specified behavioral and emotional disorders with onset usually occurring in childhood and adolescence: Secondary | ICD-10-CM

## 2023-01-02 DIAGNOSIS — F411 Generalized anxiety disorder: Secondary | ICD-10-CM | POA: Diagnosis not present

## 2023-01-02 MED ORDER — BUSPIRONE HCL 10 MG PO TABS: 10.0000 mg | ORAL_TABLET | Freq: Two times a day (BID) | ORAL | 1 refills | Status: DC | PRN

## 2023-01-02 MED ORDER — LISDEXAMFETAMINE DIMESYLATE 10 MG PO CAPS: 10.0000 mg | ORAL_CAPSULE | Freq: Every day | ORAL | 0 refills | Status: DC

## 2023-01-02 NOTE — Progress Notes (Signed)
Established Patient Office Visit  Subjective   Patient ID: Anita Caldwell, female    DOB: 2000-07-05  Age: 22 y.o. MRN: [id number]  Chief Complaint  Patient presents with   Anxiety   Depression    HPI Delphine Cryder is a 22 y.o. female presenting today for follow up of mood.  Following up for depression and anxiety, did not feel that Prozac was helpful and was making her drowsy so she tapered off of it.  She has had noticeably increased anxiety and believes that it is related to uncontrolled ADHD.  She believes that if she was able to concentrate, this would improve her anxiety.  Years ago, she tried Adderall and had bad side effects.  She has also tried Focalin which decreased her appetite too much.  She is open to trying a different medication whether that is a stimulant or nonstimulant option.  She also endorses frustration with weight management efforts that have not seemed to succeed.  She struggles with binge eating disorder and emotional eating.     01/02/2023    3:25 PM 09/01/2022    9:58 AM 08/08/2022   11:04 AM  Depression screen PHQ 2/9  Decreased Interest 0 0 0  Down, Depressed, Hopeless 0 0 0  PHQ - 2 Score 0 0 0  Altered sleeping 0  0  Tired, decreased energy 0  0  Change in appetite 3  0  Feeling bad or failure about yourself  0  0  Trouble concentrating 1  0  Moving slowly or fidgety/restless 0  0  Suicidal thoughts 0  0  PHQ-9 Score 4  0  Difficult doing work/chores Not difficult at all Not difficult at all Not difficult at all      01/02/2023    3:25 PM 11/16/2021    3:49 PM 10/26/2021    3:41 PM 09/14/2021    2:30 PM  GAD 7 : Generalized Anxiety Score  Nervous, Anxious, on Edge 0 0 0 0  Control/stop worrying 1 0 0 0  Worry too much - different things 1 0 0 0  Trouble relaxing 0 0 0 0  Restless 0 0 0 0  Easily annoyed or irritable 0 0 0 0  Afraid - awful might happen 0 0 0 0  Total GAD 7 Score 2 0 0 0  Anxiety Difficulty Not difficult at all          Outpatient Medications Prior to Visit  Medication Sig   SUMAtriptan (IMITREX) 25 MG tablet Take 1 tablet (25 mg total) by mouth once for 1 dose. May repeat in 2 hours if headache persists or recurs.   [DISCONTINUED] FLUoxetine (PROZAC) 10 MG capsule Take 1 capsule (10 mg total) by mouth daily. (Patient not taking: Reported on 01/02/2023)   [DISCONTINUED] Norethindrone Acetate-Ethinyl Estrad-FE (JUNEL FE 24) 1-20 MG-MCG(24) tablet Take 1 tablet by mouth daily. (Patient not taking: Reported on 01/02/2023)   No facility-administered medications prior to visit.    ROS Negative unless otherwise noted in HPI   Objective:     BP 113/70 (BP Location: Left Arm, Patient Position: Sitting, Cuff Size: Normal)   Pulse 81   Resp 18   Ht 5\' 5"  (1.651 m)   Wt 209 lb (94.8 kg)   SpO2 99%   BMI 34.78 kg/m   Physical Exam Constitutional:      General: She is not in acute distress.    Appearance: Normal appearance.  HENT:     Head: Normocephalic  and atraumatic.  Cardiovascular:     Rate and Rhythm: Normal rate and regular rhythm.     Heart sounds: No murmur heard.    No friction rub. No gallop.  Pulmonary:     Effort: Pulmonary effort is normal. No respiratory distress.     Breath sounds: No wheezing, rhonchi or rales.  Skin:    General: Skin is warm and dry.  Neurological:     Mental Status: She is alert and oriented to person, place, and time.     Assessment & Plan:  Attention deficit disorder (ADD) in adult Assessment & Plan: Start lowest dose of Vyvanse 10 mg once daily for ADHD and binge eating disorder.  Discussed possible side effects, discussed that it may be used daily or she can skip weekends and days that she does not need increased concentration.  Orders: -     Lisdexamfetamine Dimesylate; Take 1 capsule (10 mg total) by mouth daily.  Dispense: 30 capsule; Refill: 0  Mild binge-eating disorder Assessment & Plan: Start Vyvanse 10 mg daily for binge eating and  ADHD.  Orders: -     Lisdexamfetamine Dimesylate; Take 1 capsule (10 mg total) by mouth daily.  Dispense: 30 capsule; Refill: 0  Generalized anxiety disorder Assessment & Plan: Starting Vyvanse to improve focus and concentration.  Also may take buspirone 10 mg up to twice daily as needed to reduce anxiety if it is not well-controlled or worsens when initiating Vyvanse.  Orders: -     busPIRone HCl; Take 1 tablet (10 mg total) by mouth 2 (two) times daily as needed. As needed for anxiety, or daily.  Dispense: 30 tablet; Refill: 1    Return in about 4 weeks (around 01/30/2023) for follow-up for ADHD, starting Vyvanse, in person or video.    Melida Quitter, PA

## 2023-01-02 NOTE — Patient Instructions (Signed)
START Vyvanse for ADHD. You can take this every day, or skip days where you do not need it for concentration/focus. It will also help with binge eating. Thank you for sharing with me and letting me a part of your journey to be your happiest and healthiest :)  If you do experience anxiety, you can take the buspirone up to twice daily.

## 2023-01-02 NOTE — Assessment & Plan Note (Signed)
Start lowest dose of Vyvanse 10 mg once daily for ADHD and binge eating disorder.  Discussed possible side effects, discussed that it may be used daily or she can skip weekends and days that she does not need increased concentration.

## 2023-01-02 NOTE — Assessment & Plan Note (Signed)
Start Vyvanse 10 mg daily for binge eating and ADHD.

## 2023-01-02 NOTE — Assessment & Plan Note (Signed)
Starting Vyvanse to improve focus and concentration.  Also may take buspirone 10 mg up to twice daily as needed to reduce anxiety if it is not well-controlled or worsens when initiating Vyvanse.

## 2023-02-13 ENCOUNTER — Encounter: Payer: Self-pay | Admitting: Family Medicine

## 2023-02-13 ENCOUNTER — Telehealth: Payer: BC Managed Care – PPO | Admitting: Family Medicine

## 2023-02-13 DIAGNOSIS — F5081 Binge eating disorder, mild: Secondary | ICD-10-CM | POA: Diagnosis not present

## 2023-02-13 DIAGNOSIS — F988 Other specified behavioral and emotional disorders with onset usually occurring in childhood and adolescence: Secondary | ICD-10-CM

## 2023-02-13 MED ORDER — LISDEXAMFETAMINE DIMESYLATE 30 MG PO CAPS: 30.0000 mg | ORAL_CAPSULE | Freq: Every day | ORAL | 0 refills | Status: DC

## 2023-02-13 NOTE — Progress Notes (Signed)
Virtual Visit via Video Note  I connected with Anita Caldwell on 02/13/23 at  3:50 PM EST by a video enabled telemedicine application and verified that I am speaking with the correct person using two identifiers.  Patient Location: Home Provider Location: Office/clinic  I discussed the limitations, risks, security, and privacy concerns of performing an evaluation and management service by video and the availability of in person appointments. I also discussed with the patient that there may be a patient responsible charge related to this service. The patient expressed understanding and agreed to proceed.    Subjective   Patient ID: Anita Caldwell, female    DOB: 12-19-2000  Age: 22 y.o. MRN: [id number]  No chief complaint on file.   HPI Anita Caldwell is a 22 y.o. female presenting today for follow up of ADHD. Reports symptoms are well controlled on Vyvanse 10 mg daily which was initiated last appointment for both ADHD and binge eating disorder. Denies any problems with insomnia, chest pain, palpitations, or SOB.  At last appointment, patient believed that anxiety was stemming from difficulties concentrating and uncontrolled ADHD. She has noticed a slight improvement in concentration with the lowest dose of Vyvanse but believes she would benefit from a higher dose. She also notices increased hunger within the first couple of hourse of taking the Vyvanse which is bothersome.  ROS Negative unless otherwise noted in HPI  Outpatient Medications Prior to Visit  Medication Sig   busPIRone (BUSPAR) 10 MG tablet Take 1 tablet (10 mg total) by mouth 2 (two) times daily as needed. As needed for anxiety, or daily.   SUMAtriptan (IMITREX) 25 MG tablet Take 1 tablet (25 mg total) by mouth once for 1 dose. May repeat in 2 hours if headache persists or recurs.   [DISCONTINUED] lisdexamfetamine (VYVANSE) 10 MG capsule Take 1 capsule (10 mg total) by mouth daily.   No facility-administered medications prior to  visit.     Objective:     Physical Exam General: Speaking clearly in complete sentences without any shortness of breath.  Alert and oriented x3.  Normal judgment. No apparent acute distress.   Assessment & Plan:  Attention deficit disorder (ADD) in adult Assessment & Plan: Tolerating lowest dose of Vyvanse well, increase dose for improved concentration. Two 30-day prescriptions for Vyvanse 30 mg daily sent to pharmacy. PDMP reviewed, no aberrancies. Overdose risk score 280. Follow up in 2 months and adjust medication regimen as needed.  Orders: -     Lisdexamfetamine Dimesylate; Take 1 capsule (30 mg total) by mouth daily.  Dispense: 30 capsule; Refill: 0 -     Lisdexamfetamine Dimesylate; Take 1 capsule (30 mg total) by mouth daily.  Dispense: 30 capsule; Refill: 0  Mild binge-eating disorder -     Lisdexamfetamine Dimesylate; Take 1 capsule (30 mg total) by mouth daily.  Dispense: 30 capsule; Refill: 0 -     Lisdexamfetamine Dimesylate; Take 1 capsule (30 mg total) by mouth daily.  Dispense: 30 capsule; Refill: 0    Return in about 2 months (around 04/16/2023) for follow-up for ADHD, in person or video.   I discussed the assessment and treatment plan with the patient. The patient was provided an opportunity to ask questions, and all were answered. The patient agreed with the plan and demonstrated an understanding of the instructions.   The patient was advised to call back or seek an in-person evaluation if the symptoms worsen or if the condition fails to improve as anticipated.  The above  assessment and management plan was discussed with the patient. The patient verbalized understanding of and has agreed to the management plan.   Melida Quitter, PA

## 2023-02-13 NOTE — Assessment & Plan Note (Signed)
Tolerating lowest dose of Vyvanse well, increase dose for improved concentration. Two 30-day prescriptions for Vyvanse 30 mg daily sent to pharmacy. PDMP reviewed, no aberrancies. Overdose risk score 280. Follow up in 2 months and adjust medication regimen as needed.

## 2023-03-26 ENCOUNTER — Ambulatory Visit: Payer: 59 | Admitting: Family Medicine

## 2023-03-26 VITALS — BP 102/64 | HR 84 | Temp 98.3°F | Ht 65.0 in | Wt 205.2 lb

## 2023-03-26 DIAGNOSIS — J020 Streptococcal pharyngitis: Secondary | ICD-10-CM | POA: Diagnosis not present

## 2023-03-26 DIAGNOSIS — J029 Acute pharyngitis, unspecified: Secondary | ICD-10-CM

## 2023-03-26 LAB — POC COVID19/FLU A&B COMBO
Covid Antigen, POC: NEGATIVE
Influenza A Antigen, POC: NEGATIVE
Influenza B Antigen, POC: NEGATIVE

## 2023-03-26 LAB — POCT RAPID STREP A (OFFICE): Rapid Strep A Screen: NEGATIVE

## 2023-03-26 MED ORDER — AMOXICILLIN 875 MG PO TABS: 875.0000 mg | ORAL_TABLET | Freq: Two times a day (BID) | ORAL | 0 refills | Status: DC

## 2023-03-26 NOTE — Progress Notes (Signed)
Acute Office Visit  Subjective:     Patient ID: Anita Caldwell, female    DOB: 2000-06-19, 23 y.o.   MRN: [id number]  Chief Complaint  Patient presents with   Sore Throat    HPI Patient is in today for throat pain.  She states that her throat has been painful for the past week.  She endorses a burning sensation, denies nasal congestion, cough, other URI symptoms.  Pain is a 6/10 when swallowing.  She does have a history of strep pharyngitis and states that this does feel similar to previous strep episodes.  Denies fever, chills.  ROS See HPI    Objective:    BP 102/64   Pulse 84   Temp 98.3 F (36.8 C) (Oral)   Ht 5\' 5"  (1.651 m)   Wt 205 lb 4 oz (93.1 kg)   LMP 03/01/2023   SpO2 97%   BMI 34.16 kg/m   Physical Exam Constitutional:      General: She is not in acute distress.    Appearance: Normal appearance. She is not ill-appearing.  HENT:     Head: Normocephalic and atraumatic.     Right Ear: Tympanic membrane, ear canal and external ear normal.     Left Ear: Tympanic membrane, ear canal and external ear normal.     Nose: Nose normal. No congestion or rhinorrhea.     Mouth/Throat:     Mouth: Mucous membranes are moist.     Pharynx: No oropharyngeal exudate or posterior oropharyngeal erythema.     Comments: Tonsils are swollen Eyes:     General:        Right eye: No discharge.        Left eye: No discharge.     Conjunctiva/sclera: Conjunctivae normal.     Pupils: Pupils are equal, round, and reactive to light.  Cardiovascular:     Rate and Rhythm: Normal rate and regular rhythm.     Heart sounds: No murmur heard.    No friction rub. No gallop.  Pulmonary:     Effort: Pulmonary effort is normal. No respiratory distress.     Breath sounds: Normal breath sounds. No wheezing, rhonchi or rales.  Skin:    General: Skin is warm and dry.  Neurological:     Mental Status: She is alert and oriented to person, place, and time.    Results for orders placed or  performed in visit on 03/26/23  POC Covid19/Flu A&B Antigen  Result Value Ref Range   Influenza A Antigen, POC Negative Negative   Influenza B Antigen, POC Negative Negative   Covid Antigen, POC Negative Negative  POCT rapid strep A  Result Value Ref Range   Rapid Strep A Screen Negative Negative      Assessment & Plan:  Acute sore throat -     Amoxicillin; Take 1 tablet (875 mg total) by mouth 2 (two) times daily.  Dispense: 14 tablet; Refill: 0 -     POC Covid19/Flu A&B Antigen -     POCT rapid strep A  Strep pharyngitis -     Amoxicillin; Take 1 tablet (875 mg total) by mouth 2 (two) times daily.  Dispense: 14 tablet; Refill: 0 -     POC Covid19/Flu A&B Antigen -     POCT rapid strep A  Given clinical presentation and history, will treat for strep pharyngitis with 1 week of amoxicillin.  Return if symptoms worsen or fail to improve.  Melida Quitter, PA

## 2023-03-26 NOTE — Patient Instructions (Addendum)
ADULT OTC PAIN MEDICATIONS: Alternate Tylenol 500 mg and ibuprofen 600 mg so that you are taking one of them every 3 hours or so.  Acetaminophen (Tylenol) - Immediate-release: 325 mg to 1000 mg (1 g) orally every 4 to 6 hours - Minimum Dosing Interval: every 4 hours - Maximum Single Dose: 1000 mg - Maximum Dose: 4000 mg per 24 hours  -DO NOT TAKE IF DRINKING ALCOHOL  Ibuprofen (Advil, Midol, Motrin) -200 to 400 mg orally every 4 to 6 hours as needed -Maximum dose: 1200 mg/day (over-the-counter)  -STAY WELL HYDRATED TO REDUCE RISK OF KIDNEY DAMAGE -Increased risk of GI bleeds and kidney damage if taking too much or too often  GOALS: -good sleep -90 g protein daily minimum -25 g fiber daily  -64 oz fluid intake daily -be patient and kind to yourself :)  The amazing podcast that I mentioned to you is called The Obesity Guide with Matthea Rentea.  She is a physician who specializes in both family medicine and obesity medicine and is also on a weight management journey herself.  She has fantastic information and presented in a very empowering and empathetic way.  The nutritionist that I love is on a few social media sites. Leane Call [email or web address]  TikTok:b [email or web address] ?[email or web address]  Instagram: [email or web address]  Website: [email or web address] 

## 2023-04-12 ENCOUNTER — Encounter: Payer: Self-pay | Admitting: Family Medicine

## 2023-04-16 NOTE — Progress Notes (Signed)
ANNUAL EXAM Patient name: Anita Caldwell MRN [id number]  Date of birth: 12-20-2000 Chief Complaint:   Annual Exam  History of Present Illness:   Desera Graffeo is a 23 y.o. G1P0010 with Patient's last menstrual period was 03/28/2023. being seen today for a routine annual exam.  Current complaints:  Nervous about first Pap smear today.    Is interested in starting something for contraception, but with OCPs previously described she felt like this increased her appetite and cause weight gain.    She also notes that she had a shortened and lighter period in January and is having some nausea and cramping today.  She would like pregnancy test.  [facility] - 04/19/23 1648       Pregnancy Intention Screening   Does the patient want to become pregnant in the next year? No    Does the patient's partner want to become pregnant in the next year? No    Would the patient like to discuss contraceptive options today? Yes      Contraception Wrap Up   Current Method No Contraceptive Precautions    End Method Oral Contraceptive    Contraception Counseling Provided Yes    How was the end contraceptive method provided? Prescription            The pregnancy intention screening data noted above was reviewed. Potential methods of contraception were discussed. The patient elected to proceed with Oral Contraceptive.   Last pap never Last mammogram: n/a. Results were: N/A. Family h/o breast cancer: no Last colonoscopy: n/a. Results were: N/A. Family h/o colorectal cancer: no HPV vaccine: unsure     03/26/2023    4:01 PM 01/02/2023    3:25 PM 09/01/2022    9:58 AM 08/08/2022   11:04 AM 11/16/2021    3:49 PM  Depression screen PHQ 2/9  Decreased Interest 0 0 0 0 0  Down, Depressed, Hopeless 0 0 0 0 0  PHQ - 2 Score 0 0 0 0 0  Altered sleeping 0 0  0 0  Tired, decreased energy 0 0  0 0  Change in appetite 1 3  0 0  Feeling bad or failure about yourself  0 0  0 0  Trouble concentrating 0 1  0 0  Moving  slowly or fidgety/restless 0 0  0 0  Suicidal thoughts 0 0  0 0  PHQ-9 Score 1 4  0 0  Difficult doing work/chores  Not difficult at all Not difficult at all Not difficult at all         03/26/2023    4:01 PM 01/02/2023    3:25 PM 11/16/2021    3:49 PM 10/26/2021    3:41 PM  GAD 7 : Generalized Anxiety Score  Nervous, Anxious, on Edge 0 0 0 0  Control/stop worrying 0 1 0 0  Worry too much - different things 0 1 0 0  Trouble relaxing 0 0 0 0  Restless 0 0 0 0  Easily annoyed or irritable 0 0 0 0  Afraid - awful might happen 0 0 0 0  Total GAD 7 Score 0 2 0 0  Anxiety Difficulty  Not difficult at all       Review of Systems:   Pertinent items are noted in HPI Denies any headaches, blurred vision, fatigue, shortness of breath, chest pain, abdominal pain, abnormal vaginal discharge/itching/odor/irritation, problems with periods, bowel movements, urination, or intercourse unless otherwise stated above. Pertinent History Reviewed:  Reviewed past medical,surgical, social  and family history.  Reviewed problem list, medications and allergies. Physical Assessment:   Vitals:   04/19/23 1518  BP: 131/79  Pulse: 82  Resp: 16  Weight: 208 lb (94.3 kg)  Height: 5\' 5"  (1.651 m)  Body mass index is 34.61 kg/m.        Physical Examination:   General appearance - well appearing, and in no distress  Mental status - alert, oriented to person, place, and time  Chest - respiratory effort normal  Heart - normal peripheral perfusion  Breasts -deferred  Abdomen - soft, nontender, nondistended, no masses or organomegaly  Pelvic - VULVA: normal appearing vulva with no masses, tenderness or lesions  VAGINA: normal appearing vagina with normal color and discharge, no lesions  CERVIX: normal appearing cervix without discharge or lesions, no CMT  Thin prep pap is done with reflex HR HPV cotesting  Chaperone present for exam  Results for orders placed or performed in visit on 04/19/23 (from the  past 24 hours)  POCT urine pregnancy   Collection Time: 04/19/23  3:53 PM  Result Value Ref Range   Preg Test, Ur Negative Negative    Assessment & Plan:  1) Well-Woman Exam Mammogram: @ 21yo, or sooner if problems Colonoscopy: @ 21yo, or sooner if problems Pap: Collected today Gardasil: Discussed GC/CT: Recommended & collected HIV/HCV:Declined, no new partner since her testing 06/2021 UPT negative Counseled patient on prescription options for contraception including IUD, Nexplanon, Depo, pill, patch, ring, barrier methods.  She opts for oral contraceptive. Would like to try a different pill from the Junel sent last visit  Labs/procedures today:   Orders Placed This Encounter  Procedures   POCT urine pregnancy   Meds:  Meds ordered this encounter  Medications   drospirenone-ethinyl estradiol (YAZ) 3-0.02 MG tablet    Sig: Take 1 tablet by mouth daily.    Dispense:  28 tablet    Refill:  11   Follow-up: Return in about 1 year (around 04/18/2024) for annual exam or sooner as needed.  Lennart Pall, MD 04/19/2023 4:59 PM

## 2023-04-19 ENCOUNTER — Encounter: Payer: Self-pay | Admitting: Obstetrics and Gynecology

## 2023-04-19 ENCOUNTER — Ambulatory Visit (INDEPENDENT_AMBULATORY_CARE_PROVIDER_SITE_OTHER): Payer: 59 | Admitting: Obstetrics and Gynecology

## 2023-04-19 ENCOUNTER — Other Ambulatory Visit (HOSPITAL_COMMUNITY)
Admission: RE | Admit: 2023-04-19 | Discharge: 2023-04-19 | Disposition: A | Discharge status: Home / Self Care | Payer: 59 | Source: Ambulatory Visit | Attending: Obstetrics and Gynecology | Admitting: Obstetrics and Gynecology

## 2023-04-19 VITALS — BP 131/79 | HR 82 | Resp 16 | Ht 65.0 in | Wt 208.0 lb

## 2023-04-19 DIAGNOSIS — Z01419 Encounter for gynecological examination (general) (routine) without abnormal findings: Secondary | ICD-10-CM | POA: Diagnosis not present

## 2023-04-19 DIAGNOSIS — Z124 Encounter for screening for malignant neoplasm of cervix: Secondary | ICD-10-CM | POA: Diagnosis present

## 2023-04-19 LAB — POCT URINE PREGNANCY: Preg Test, Ur: NEGATIVE

## 2023-04-19 MED ORDER — DROSPIRENONE-ETHINYL ESTRADIOL 3-0.02 MG PO TABS
1.0000 | ORAL_TABLET | Freq: Every day | ORAL | 11 refills | Status: DC
Start: 2023-04-19 — End: 2023-05-30

## 2023-04-22 LAB — CYTOLOGY - PAP
Chlamydia: NEGATIVE
Comment: NEGATIVE
Comment: NORMAL
Diagnosis: NEGATIVE
Neisseria Gonorrhea: NEGATIVE

## 2023-04-23 ENCOUNTER — Encounter: Payer: Self-pay | Admitting: Obstetrics and Gynecology

## 2023-05-30 ENCOUNTER — Ambulatory Visit: Admitting: Family Medicine

## 2023-05-30 ENCOUNTER — Encounter: Payer: Self-pay | Admitting: Family Medicine

## 2023-05-30 VITALS — BP 126/68 | HR 81 | Resp 16 | Ht 65.0 in | Wt 207.0 lb

## 2023-05-30 DIAGNOSIS — G43009 Migraine without aura, not intractable, without status migrainosus: Secondary | ICD-10-CM

## 2023-05-30 DIAGNOSIS — F411 Generalized anxiety disorder: Secondary | ICD-10-CM

## 2023-05-30 DIAGNOSIS — E66811 Obesity, class 1: Secondary | ICD-10-CM | POA: Diagnosis not present

## 2023-05-30 DIAGNOSIS — Z6834 Body mass index (BMI) 34.0-34.9, adult: Secondary | ICD-10-CM

## 2023-05-30 DIAGNOSIS — F5081 Binge eating disorder, mild: Secondary | ICD-10-CM

## 2023-05-30 DIAGNOSIS — F988 Other specified behavioral and emotional disorders with onset usually occurring in childhood and adolescence: Secondary | ICD-10-CM | POA: Diagnosis not present

## 2023-05-30 DIAGNOSIS — Z7689 Persons encountering health services in other specified circumstances: Secondary | ICD-10-CM

## 2023-05-30 DIAGNOSIS — Z713 Dietary counseling and surveillance: Secondary | ICD-10-CM

## 2023-05-30 MED ORDER — SUMATRIPTAN SUCCINATE 25 MG PO TABS
25.0000 mg | ORAL_TABLET | Freq: Once | ORAL | 0 refills | Status: DC
Start: 2023-05-30 — End: 2023-09-19

## 2023-05-30 MED ORDER — LISDEXAMFETAMINE DIMESYLATE 10 MG PO CAPS: 10.0000 mg | ORAL_CAPSULE | Freq: Every day | ORAL | 0 refills | Status: DC

## 2023-05-30 NOTE — Patient Instructions (Signed)
 Mediterranean diet -- There is no single definition, but a Mediterranean diet is typically high in fruits, vegetables, whole grains, beans, nuts, and seeds. A Mediterranean diet includes olive oil as an important source of monounsaturated fat and allows low to moderate wine consumption. It generally includes low to moderate amounts of fish, poultry, and dairy products, and it includes little red meat (table 2). Although the Mediterranean diet is associated with several health benefits, it remains uncertain if a single component of the Mediterranean diet is associated with the protective benefit or if the benefit results from an aggregation of effects. ?In a meta-analysis of randomized trials including the large PREDIMED trial [37], a Mediterranean diet reduced the risk of stroke compared with a low-fat diet (hazard ratio [HR] 0.60, 95% CI 0.45-0.80) but did not reduce the incidence of cardiovascular or overall mortality [38]. ?In a subsequent 2022 trial including over 1000 adults with coronary heart disease conducted in [place] (CORDIOPREV), randomization to a Mediterranean diet resulted in fewer major cardiovascular events (myocardial infarction, revascularization, ischemic stroke, peripheral artery disease, cardiovascular death) compared with a low-fat diet at seven years' follow-up [39]. ?In a network meta-analysis of 40 trials comparing seven dietary patterns among 35,?548 participants at increased risk of CVD, a Mediterranean diet (12 trials) resulted in lower risk of several outcomes including all-cause mortality (odds ratio [OR] 0.72, 95% CI 0.56-0.92), cardiovascular mortality (OR 0.55, 95% CI 0.39-0.78), stroke (OR 0.65, 95% CI 0.46-0.93), and nonfatal myocardial infarction (OR 0.48, 95% CI 0.36-0.65), when compared with minimal intervention [40]. ?In observational studies, a Mediterranean diet has been associated with lower overall mortality and cardiovascular mortality [41-45], as well as a decreased  incidence of Parkinson disease, Alzheimer disease, and cancers, including colorectal, prostate, aerodigestive, oropharyngeal, and breast cancers [42,43,46-48]. (See "Factors that modify breast cancer risk in females", section on 'Diet rich in fruits and vegetables, fish, and olive oil (eg, Mediterranean diet)'.) The effects of the Mediterranean diet on type 2 diabetes mellitus and macular degeneration are discussed separately. (See "Medical nutrition therapy for type 2 diabetes mellitus", section on 'Macronutrient composition' and "Age-related macular degeneration", section on 'Diet/alcohol'.)  We had a conversation today about healthy eating habits, exercise, calorie and carb goals for sustainable and successful weight loss. I gave Anita Caldwell caloric and protein daily intake values and encouraged increasing fiber and water intake. I discussed weight loss medications that could be used with consideration of pts specific past medical history and financial limitations.  I explained to Anita Caldwell 3 things that almost all people who lose weight and keep it off do: 1.  Cut out all sugary beverages and all calorie-containing beverages  2.  Resistance training (cardiac exercise not as important for maintaining muscle mass) 3.  Actually counting calories and carbs and deciding against foods that have high amounts - I.e. Check nutrition labels.   Referrals and resources offered (though may be limited by insurance coverage and cost) including but not limited to registered dietician consult, medical weight management referral, other plans/resources to consult (weight watchers, noom) and reviewed and addressed pt own estimated caloric needs per day by age, sex, and physical activity level - explained that pt needs to be net negative calories weekly to loose weight.   2000-2200 cal per day to just stay weight neutral - net calories negative to loose weight, 3500 cal = 1 lb Try body measurements with weights once  a week to get more data points instead of just the scale

## 2023-05-30 NOTE — Progress Notes (Unsigned)
 Name: Anita Caldwell   MRN: [id number]    DOB: 06-01-2000   Date:05/30/2023       Progress Note  Chief Complaint  Patient presents with   Establish Care     Subjective:   Anita Caldwell is a 23 y.o. female, presents to clinic for routine follow up on chronic conditions  Pt transitioning care here for primary care her pcp left the practice   Concern with weight and binge eating  Working on healthy foods whole foods She has lost weight 6 lbs Wt Readings from Last 5 Encounters:  05/30/23 207 lb (93.9 kg)  04/19/23 208 lb (94.3 kg)  03/26/23 205 lb 4 oz (93.1 kg)  01/02/23 209 lb (94.8 kg)  09/01/22 206 lb 12.8 oz (93.8 kg)   BMI Readings from Last 5 Encounters:  05/30/23 34.45 kg/m  04/19/23 34.61 kg/m  03/26/23 34.16 kg/m  01/02/23 34.78 kg/m  09/01/22 34.41 kg/m    Ha/migraines: Imitrex she hasn't needed a dose in a while Buspar using for anxiety   She stopped Yaz due to cramping SE - prescribed by her GYN  ADHD:  was trying vyvanse did 10 mg dose, it helped with sx but she did not continue on with medication, last rx was changed to higher dose and pt was unsure about this PDMP reviewed as well as prior OV       Current Outpatient Medications:    SUMAtriptan (IMITREX) 25 MG tablet, Take 1 tablet (25 mg total) by mouth once for 1 dose. May repeat in 2 hours if headache persists or recurs., Disp: 10 tablet, Rfl: 0   busPIRone (BUSPAR) 10 MG tablet, Take 1 tablet (10 mg total) by mouth 2 (two) times daily as needed. As needed for anxiety, or daily. (Patient not taking: Reported on 04/19/2023), Disp: 30 tablet, Rfl: 1   drospirenone-ethinyl estradiol (YAZ) 3-0.02 MG tablet, Take 1 tablet by mouth daily., Disp: 28 tablet, Rfl: 11   lisdexamfetamine (VYVANSE) 30 MG capsule, Take 1 capsule (30 mg total) by mouth daily. (Patient not taking: Reported on 04/19/2023), Disp: 30 capsule, Rfl: 0   lisdexamfetamine (VYVANSE) 30 MG capsule, Take 1 capsule (30 mg total) by mouth  daily. (Patient not taking: Reported on 04/19/2023), Disp: 30 capsule, Rfl: 0  Patient Active Problem List   Diagnosis Date Noted   Mild binge-eating disorder 01/02/2023   Panic attack 08/27/2022   Generalized anxiety disorder 08/27/2022   Migraine without aura and without status migrainosus, not intractable 11/07/2021   Attention deficit disorder (ADD) in adult 09/14/2021   Vitamin D deficiency 04/18/2021   BMI 34.0-34.9,adult 03/13/2021    History reviewed. No pertinent surgical history.  Family History  Problem Relation Age of Onset   Depression Mother    Depression Father    Depression Maternal Grandmother     Social History   Tobacco Use   Smoking status: Never    Passive exposure: Never   Smokeless tobacco: Never  Vaping Use   Vaping status: Never Used  Substance Use Topics   Alcohol use: Never   Drug use: Never     No Known Allergies  Health Maintenance  Topic Date Due   INFLUENZA VACCINE  06/04/2023 (Originally 10/05/2022)   COVID-19 Vaccine (1 - 2024-25 season) 06/15/2023 (Originally 11/05/2022)   DTaP/Tdap/Td (3 - Tdap) 05/29/2024 (Originally 12/02/2014)   HPV VACCINES (1 - 3-dose series) 05/29/2024 (Originally 04/11/2017)   CHLAMYDIA SCREENING  04/18/2024   Cervical Cancer Screening (Pap smear)  04/18/2026  Hepatitis C Screening  Completed   HIV Screening  Completed    Chart Review Today: I personally reviewed active problem list, medication list, allergies, family history, social history, health maintenance, notes from last encounter, lab results, imaging with the patient/caregiver today.   Review of Systems  Constitutional: Negative.   HENT: Negative.    Eyes: Negative.   Respiratory: Negative.    Cardiovascular: Negative.   Gastrointestinal: Negative.   Endocrine: Negative.   Genitourinary: Negative.   Musculoskeletal: Negative.   Skin: Negative.   Allergic/Immunologic: Negative.   Neurological: Negative.   Hematological: Negative.    Psychiatric/Behavioral: Negative.    All other systems reviewed and are negative.    Objective:   Vitals:   05/30/23 1505  BP: 126/68  Pulse: 81  Resp: 16  SpO2: 99%  Weight: 207 lb (93.9 kg)  Height: 5\' 5"  (1.651 m)    Body mass index is 34.45 kg/m.  Physical Exam Vitals and nursing note reviewed.  Constitutional:      General: She is not in acute distress.    Appearance: Normal appearance. She is well-developed. She is obese. She is not ill-appearing, toxic-appearing or diaphoretic.  HENT:     Head: Normocephalic and atraumatic.     Nose: Nose normal.  Eyes:     General:        Right eye: No discharge.        Left eye: No discharge.     Conjunctiva/sclera: Conjunctivae normal.  Neck:     Trachea: No tracheal deviation.  Cardiovascular:     Rate and Rhythm: Normal rate and regular rhythm.     Pulses: Normal pulses.     Heart sounds: Normal heart sounds. No murmur heard.    No friction rub. No gallop.  Pulmonary:     Effort: Pulmonary effort is normal. No respiratory distress.     Breath sounds: Normal breath sounds. No stridor. No wheezing, rhonchi or rales.  Skin:    General: Skin is warm and dry.     Findings: No rash.  Neurological:     Mental Status: She is alert.     Motor: No abnormal muscle tone.     Coordination: Coordination normal.  Psychiatric:        Mood and Affect: Mood normal.        Behavior: Behavior normal.      Functional Status Survey: Is the patient deaf or have difficulty hearing?: No Does the patient have difficulty seeing, even when wearing glasses/contacts?: No Does the patient have difficulty concentrating, remembering, or making decisions?: No Does the patient have difficulty walking or climbing stairs?: No Does the patient have difficulty dressing or bathing?: No Does the patient have difficulty doing errands alone such as visiting a doctor's office or shopping?: No Results for orders placed or performed in visit on 04/19/23   Cytology - PAP( [place])   Collection Time: 04/19/23  3:51 PM  Result Value Ref Range   Neisseria Gonorrhea Negative    Chlamydia Negative    Adequacy      Satisfactory for evaluation; transformation zone component PRESENT.   Diagnosis      - Negative for intraepithelial lesion or malignancy (NILM)   Comment Normal Reference Ranger Chlamydia - Negative    Comment      Normal Reference Range Neisseria Gonorrhea - Negative  POCT urine pregnancy   Collection Time: 04/19/23  3:53 PM  Result Value Ref Range   Preg Test, Ur Negative Negative  Assessment & Plan:   Attention deficit disorder (ADD) in adult Assessment & Plan: Prior dx and meds reviewed and verified She did have benefit from vyvanse 10 mg, but stopped when dose was increased to 30 mg Trial again of 10 mg to increase to 20 mg and we will do close 4 week f/up on benefits/tolerance  Orders: -     Lisdexamfetamine Dimesylate; Take 1-2 capsules (10-20 mg total) by mouth daily.  Dispense: 60 capsule; Refill: 0  Generalized anxiety disorder Assessment & Plan:    05/30/2023    3:09 PM 03/26/2023    4:01 PM 01/02/2023    3:25 PM  Depression screen PHQ 2/9  Decreased Interest 0 0 0  Down, Depressed, Hopeless 0 0 0  PHQ - 2 Score 0 0 0  Altered sleeping  0 0  Tired, decreased energy  0 0  Change in appetite  1 3  Feeling bad or failure about yourself   0 0  Trouble concentrating  0 1  Moving slowly or fidgety/restless  0 0  Suicidal thoughts  0 0  PHQ-9 Score  1 4  Difficult doing work/chores   Not difficult at all      05/30/2023    3:09 PM 03/26/2023    4:01 PM 01/02/2023    3:25 PM 11/16/2021    3:49 PM  GAD 7 : Generalized Anxiety Score  Nervous, Anxious, on Edge 1 0 0 0  Control/stop worrying 1 0 1 0  Worry too much - different things 1 0 1 0  Trouble relaxing 1 0 0 0  Restless 0 0 0 0  Easily annoyed or irritable 0 0 0 0  Afraid - awful might happen 0 0 0 0  Total GAD 7 Score 4 0 2 0  Anxiety  Difficulty   Not difficult at all    Phq 9 and GAD 7 reviewed Gad 7 mildly positive  No past benefit from buspar  Psych/therapy may be helpful     Mild binge-eating disorder Assessment & Plan: Discussed healthy diet habits and relationship with food Medical weight management resources and manage may really help her progress - she is making significant efforts, but still has difficulty with compulsion to eat/binge   Orders: -     Amb Ref to Medical Weight Management  Class 1 obesity with body mass index (BMI) of 34.0 to 34.9 in adult, unspecified obesity type, unspecified whether serious comorbidity present -     Amb Ref to Medical Weight Management  Migraine without aura and without status migrainosus, not intractable Assessment & Plan: Not well controlled with tylenol She can try excedrine migraine Last Rx for imitrex pt has not tried yet, refills ordered so they are available Previously nurtec per chart worked well for her but was not covered by insurance  Orders: -     SUMAtriptan Succinate; Take 1 tablet (25 mg total) by mouth once for 1 dose. May repeat in 2 hours if headache persists or recurs.  Dispense: 10 tablet; Refill: 0  Encounter to establish care with new doctor Reviewed chart/meds/OV extensively and updated  BMI 34.0-34.9,adult Assessment & Plan: Wt Readings from Last 5 Encounters:  05/30/23 207 lb (93.9 kg)  04/19/23 208 lb (94.3 kg)  03/26/23 205 lb 4 oz (93.1 kg)  01/02/23 209 lb (94.8 kg)  09/01/22 206 lb 12.8 oz (93.8 kg)   BMI Readings from Last 5 Encounters:  05/30/23 34.45 kg/m  04/19/23 34.61 kg/m  03/26/23 34.16 kg/m  01/02/23 34.78 kg/m  09/01/22 34.41 kg/m  She is working on cutting out soda and increasing her exercise and trying to eat more whole foods and healthy foods but getting discouraged by lack of progress and with compulsion to binge eat unhealthy foods Healthy diet lifestyle counseling recommendations given today and resources  have been printed for her Reviewed strategies to help support her in her efforts including continuing to cut out soda and any sugary beverages, continuing to focus on smaller goals like increasing protein in meals or making sure she eats fruits and vegetables daily as opposed to severe restriction of calorie and foods with then binge eating following Reviewed caloric daily needs for her age and activity level -which is around 2000 2200, discussed that negative calories which can help her slowly and healthily lose weight possible goals 1700-1900 cal per day and monitor weekly with other data/measurements besides weight/scale She has been working out very hard and has noticed increased muscle mass but is concerned that she only lost 6 pounds explained that she is likely getting healthier even if the scale does not show all of the health changes Do think medical weight management would be very helpful for her     Dietary counseling and surveillance See above    Return for 4 week med f/up .   Danelle Berry, PA-C 05/30/23 3:34 PM

## 2023-05-31 DIAGNOSIS — Z0289 Encounter for other administrative examinations: Secondary | ICD-10-CM

## 2023-05-31 NOTE — Assessment & Plan Note (Signed)
    05/30/2023    3:09 PM 03/26/2023    4:01 PM 01/02/2023    3:25 PM  Depression screen PHQ 2/9  Decreased Interest 0 0 0  Down, Depressed, Hopeless 0 0 0  PHQ - 2 Score 0 0 0  Altered sleeping  0 0  Tired, decreased energy  0 0  Change in appetite  1 3  Feeling bad or failure about yourself   0 0  Trouble concentrating  0 1  Moving slowly or fidgety/restless  0 0  Suicidal thoughts  0 0  PHQ-9 Score  1 4  Difficult doing work/chores   Not difficult at all      05/30/2023    3:09 PM 03/26/2023    4:01 PM 01/02/2023    3:25 PM 11/16/2021    3:49 PM  GAD 7 : Generalized Anxiety Score  Nervous, Anxious, on Edge 1 0 0 0  Control/stop worrying 1 0 1 0  Worry too much - different things 1 0 1 0  Trouble relaxing 1 0 0 0  Restless 0 0 0 0  Easily annoyed or irritable 0 0 0 0  Afraid - awful might happen 0 0 0 0  Total GAD 7 Score 4 0 2 0  Anxiety Difficulty   Not difficult at all    Phq 9 and GAD 7 reviewed Gad 7 mildly positive  No past benefit from buspar  Psych/therapy may be helpful

## 2023-05-31 NOTE — Assessment & Plan Note (Signed)
 Wt Readings from Last 5 Encounters:  05/30/23 207 lb (93.9 kg)  04/19/23 208 lb (94.3 kg)  03/26/23 205 lb 4 oz (93.1 kg)  01/02/23 209 lb (94.8 kg)  09/01/22 206 lb 12.8 oz (93.8 kg)   BMI Readings from Last 5 Encounters:  05/30/23 34.45 kg/m  04/19/23 34.61 kg/m  03/26/23 34.16 kg/m  01/02/23 34.78 kg/m  09/01/22 34.41 kg/m  She is working on cutting out soda and increasing her exercise and trying to eat more whole foods and healthy foods but getting discouraged by lack of progress and with compulsion to binge eat unhealthy foods Healthy diet lifestyle counseling recommendations given today and resources have been printed for her Reviewed strategies to help support her in her efforts including continuing to cut out soda and any sugary beverages, continuing to focus on smaller goals like increasing protein in meals or making sure she eats fruits and vegetables daily as opposed to severe restriction of calorie and foods with then binge eating following Reviewed caloric daily needs for her age and activity level -which is around 2000 2200, discussed that negative calories which can help her slowly and healthily lose weight possible goals 1700-1900 cal per day and monitor weekly with other data/measurements besides weight/scale She has been working out very hard and has noticed increased muscle mass but is concerned that she only lost 6 pounds explained that she is likely getting healthier even if the scale does not show all of the health changes Do think medical weight management would be very helpful for her

## 2023-05-31 NOTE — Assessment & Plan Note (Signed)
 Discussed healthy diet habits and relationship with food Medical weight management resources and manage may really help her progress - she is making significant efforts, but still has difficulty with compulsion to eat/binge

## 2023-05-31 NOTE — Assessment & Plan Note (Signed)
 Prior dx and meds reviewed and verified She did have benefit from vyvanse 10 mg, but stopped when dose was increased to 30 mg Trial again of 10 mg to increase to 20 mg and we will do close 4 week f/up on benefits/tolerance

## 2023-05-31 NOTE — Assessment & Plan Note (Signed)
 Not well controlled with tylenol She can try excedrine migraine Last Rx for imitrex pt has not tried yet, refills ordered so they are available Previously nurtec per chart worked well for her but was not covered by [employer]

## 2023-06-01 ENCOUNTER — Encounter: Admitting: Family Medicine

## 2023-06-04 ENCOUNTER — Encounter: Payer: Self-pay | Admitting: Bariatrics

## 2023-06-04 ENCOUNTER — Ambulatory Visit (INDEPENDENT_AMBULATORY_CARE_PROVIDER_SITE_OTHER): Admitting: Bariatrics

## 2023-06-04 VITALS — BP 122/79 | HR 75 | Temp 98.1°F | Ht 65.5 in | Wt 201.0 lb

## 2023-06-04 DIAGNOSIS — F5089 Other specified eating disorder: Secondary | ICD-10-CM | POA: Diagnosis not present

## 2023-06-04 DIAGNOSIS — Z6832 Body mass index (BMI) 32.0-32.9, adult: Secondary | ICD-10-CM

## 2023-06-04 DIAGNOSIS — E669 Obesity, unspecified: Secondary | ICD-10-CM

## 2023-06-04 DIAGNOSIS — G43009 Migraine without aura, not intractable, without status migrainosus: Secondary | ICD-10-CM

## 2023-06-04 DIAGNOSIS — F5081 Binge eating disorder, mild: Secondary | ICD-10-CM

## 2023-06-04 NOTE — Progress Notes (Signed)
 Office: [phone]  /  Fax: [phone]   Initial Visit  Anita Caldwell was seen in clinic today to evaluate for obesity. She is interested in losing weight to improve overall health and reduce the risk of weight related complications. She presents today to review program treatment options, initial physical assessment, and evaluation.     She was referred by: PCP  When asked what else they would like to accomplish? She states: Adopt healthier eating patterns, Improve energy levels and physical activity, Improve existing medical conditions, and Improve quality of life  When asked how has your weight affected you? She states: Having poor endurance  Some associated conditions: Vitamin D Deficiency and Other: Migraines  Contributing factors: Family history of obesity and Slow metabolism for age  Weight promoting medications identified: None  Current nutrition plan: None, Portion control / smart choices, Journaling, and Other: cut out soda and processed food.   Current level of physical activity: None and Step counting 5,000 to 8,000, sometimes up to 10,000 and does do some weights.   Current or previous pharmacotherapy: None and Is interested in pharmacotherapy  Response to medication: Never tried medications   Past medical history includes:   Past Medical History:  Diagnosis Date   Anxiety      Objective:   BP 122/79   Pulse 75   Temp 98.1 F (36.7 C)   Ht 5' 5.5" (1.664 m)   Wt 201 lb (91.2 kg)   LMP 05/23/2023   SpO2 100%   BMI 32.94 kg/m  She was weighed on the bioimpedance scale: Body mass index is 32.94 kg/m.  Peak Weight:212 lbs , Body Fat%:37.5, Visceral Fat Rating:6, Weight trend over the last 12 months: Fluctuating.   General:  Alert, oriented and cooperative. Patient is in no acute distress.  Respiratory: Normal respiratory effort, no problems with respiration noted  Extremities: Normal range of motion.    Mental Status: Normal mood and affect. Normal  behavior. Normal judgment and thought content.   DIAGNOSTIC DATA REVIEWED:  BMET    Component Value Date/Time   NA 141 06/08/2021 1049   K 4.3 06/08/2021 1049   CL 107 (H) 06/08/2021 1049   CO2 20 06/08/2021 1049   GLUCOSE 90 06/08/2021 1049   BUN 12 06/08/2021 1049   CREATININE 0.70 06/08/2021 1049   CALCIUM 9.2 06/08/2021 1049   Lab Results  Component Value Date   HGBA1C 5.3 04/11/2021   No results found for: "INSULIN" CBC    Component Value Date/Time   WBC 5.3 05/29/2022 1011   RBC 5.04 05/29/2022 1011   HGB 13.4 05/29/2022 1011   HCT 42.2 05/29/2022 1011   PLT 299 05/29/2022 1011   MCV 84 05/29/2022 1011   MCH 26.6 05/29/2022 1011   MCHC 31.8 05/29/2022 1011   RDW 13.6 05/29/2022 1011   Iron/TIBC/Ferritin/ %Sat No results found for: "IRON", "TIBC", "FERRITIN", "IRONPCTSAT" Lipid Panel     Component Value Date/Time   CHOL 186 (H) 04/11/2021 0824   TRIG 85 04/11/2021 0824   HDL 52 04/11/2021 0824   CHOLHDL 3.6 04/11/2021 0824   LDLCALC 118 (H) 04/11/2021 0824   Hepatic Function Panel     Component Value Date/Time   PROT 6.8 04/11/2021 0824   ALBUMIN 4.6 04/11/2021 0824   AST 14 04/11/2021 0824   ALT 14 04/11/2021 0824   ALKPHOS 74 04/11/2021 0824   BILITOT <0.2 04/11/2021 0824      Component Value Date/Time   TSH 1.640 04/11/2021 9528  Assessment and Plan:   Migraines with aura / without aura.   She was diagnosed with  migraines  and is well controlled with Imitrex.  She is currently taking the following medications. Imitrex as needed about 2 X a month.  She is not seeing a neurologist.    Plan:  Discussed the benefit that losing weight may on the frequency and intensity of migraines.  Will continue Imitrex.  Will avoid trigger substances and food.  Will practice good sleep hygiene.  Discussed the importance of exercise.   Eating disorder/emotional eating Anita Caldwell has had issues with stress eating, emotional eating, and binge  eating. Currently appetite  is moderately controlled. Overall mood is stable. Denies suicidal/homicidal ideation. Medication(s): none  Plan: Consider a referral to Dr. Dewaine Conger, PhD psychologist to help with eating behaviors.  Be sure to get adequate rest as lack of rest can trigger appetite.  Have plan in place for stressful events.     Generalized Obesity: Current BMI 32.94    Obesity Treatment / Action Plan:  Patient will work on garnering support from family and friends to begin weight loss journey. Will work on eliminating or reducing the presence of highly palatable, calorie dense foods in the home. Will complete provided nutritional and psychosocial assessment questionnaire before the next appointment. Will be scheduled for indirect calorimetry to determine resting energy expenditure in a fasting state.  This will allow [place] to create a reduced calorie, high-protein meal plan to promote loss of fat mass while preserving muscle mass. Counseled on the health benefits of losing 5%-15% of total body weight. Was counseled on nutritional approaches to weight loss and benefits of reducing processed foods and consuming plant-based foods and high quality protein as part of nutritional weight management. Was counseled on pharmacotherapy and role as an adjunct in weight management.   Obesity Education Performed Today:  She was weighed on the bioimpedance scale and results were discussed and documented in the synopsis.  We discussed obesity as a disease and the importance of a more detailed evaluation of all the factors contributing to the disease.  We discussed the importance of long term lifestyle changes which include nutrition, exercise and behavioral modifications as well as the importance of customizing this to her specific health and social needs.  We discussed the benefits of reaching a healthier weight to alleviate the symptoms of existing conditions and reduce the risks of the  biomechanical, metabolic and psychological effects of obesity.  Discussed New Patient/Late Arrival, and Cancellation Policies. Patient voiced understanding and allowed to ask questions.   Anita Caldwell appears to be in the action stage of change and states they are ready to start intensive lifestyle modifications and behavioral modifications.  30 minutes was spent today on this visit including the above counseling, pre-visit chart review, and post-visit documentation.  Reviewed by clinician on day of visit: allergies, medications, problem list, medical history, surgical history, family history, social history, and previous encounter notes.    Janvi Ammar A. Lorretta HarpO.

## 2023-06-18 ENCOUNTER — Encounter: Payer: Self-pay | Admitting: Bariatrics

## 2023-06-18 ENCOUNTER — Ambulatory Visit (INDEPENDENT_AMBULATORY_CARE_PROVIDER_SITE_OTHER): Admitting: Bariatrics

## 2023-06-18 VITALS — BP 114/69 | HR 69 | Temp 97.6°F | Ht 65.5 in | Wt 202.0 lb

## 2023-06-18 DIAGNOSIS — E669 Obesity, unspecified: Secondary | ICD-10-CM

## 2023-06-18 DIAGNOSIS — Z0000 Encounter for general adult medical examination without abnormal findings: Secondary | ICD-10-CM

## 2023-06-18 DIAGNOSIS — Z1331 Encounter for screening for depression: Secondary | ICD-10-CM

## 2023-06-18 DIAGNOSIS — E559 Vitamin D deficiency, unspecified: Secondary | ICD-10-CM

## 2023-06-18 DIAGNOSIS — F5081 Binge eating disorder, mild: Secondary | ICD-10-CM | POA: Diagnosis not present

## 2023-06-18 DIAGNOSIS — R7309 Other abnormal glucose: Secondary | ICD-10-CM | POA: Diagnosis not present

## 2023-06-18 DIAGNOSIS — E66811 Obesity, class 1: Secondary | ICD-10-CM

## 2023-06-18 DIAGNOSIS — Z6833 Body mass index (BMI) 33.0-33.9, adult: Secondary | ICD-10-CM

## 2023-06-18 DIAGNOSIS — R5383 Other fatigue: Secondary | ICD-10-CM

## 2023-06-18 DIAGNOSIS — G43009 Migraine without aura, not intractable, without status migrainosus: Secondary | ICD-10-CM | POA: Diagnosis not present

## 2023-06-18 DIAGNOSIS — R0602 Shortness of breath: Secondary | ICD-10-CM

## 2023-06-18 NOTE — Progress Notes (Signed)
 At a Glance:  Vitals Temp: 97.6 F (36.4 C) BP: 114/69 Pulse Rate: 69 SpO2: 100 %   Anthropometric Measurements Height: 5' 5.5" (1.664 m) Weight: 202 lb (91.6 kg) BMI (Calculated): 33.09 Starting Weight: 202lb Peak Weight: 212lb   Body Composition  Body Fat %: 38.2 % Fat Mass (lbs): 77.4 lbs Muscle Mass (lbs): 118.6 lbs Total Body Water (lbs): 82.4 lbs Visceral Fat Rating : 6   Other Clinical Data RMR: 1858 Fasting: yes Labs: yes Today's Visit #: 1 Starting Date: 06/18/23    EKG: Normal sinus rhythm, rate 77.  Indirect Calorimeter:   Resting Metabolic Rate ( RMR):  RMR (actual): 1858 kcal RMR (calculated): 1781 kcal The calculated basal metabolic rate is [id number] thus her basal metabolic rate is better than expected.  Plan:   Indirect calorimeter completed, interpreted and reviewed with patient today and allowed to ask questions.  Discussed the implications for the chosen plan and exercise based on the RMR reading.  Will consider repeating the RMR in the future based on weight loss.    Chief Complaint:  Obesity   Subjective:  Anita Caldwell (MR# [id number]) is a 23 y.o. female who presents for evaluation and treatment of obesity and related comorbidities.   Anita Caldwell is currently in the action stage of change and ready to dedicate time achieving and maintaining a healthier weight. Anita Caldwell is interested in becoming our patient and working on intensive lifestyle modifications including (but not limited to) diet and exercise for weight loss.  Anita Caldwell has been struggling with her weight. She has been unsuccessful in either losing weight, maintaining weight loss, or reaching her healthy weight goal.  Anita Caldwell's habits were reviewed today and are as follows: Her family eats meals together, she thinks her family will eat healthier with her, she started gaining weight in the 5th grade., she has significant food cravings issues, she snacks frequently in the evenings,  she skips meals frequently, she is frequently drinking liquids with calories, she frequently eats larger portions than normal, and she has binge eating behaviors.  Current or previous pharmacotherapy: None and Is interested in pharmacotherapy  Response to medication: Never tried medications  Other Fatigue Anita Caldwell  sometimes she has  daytime somnolence and admits to waking up still tired. Patient has a history of symptoms of morning fatigue. Anita Caldwell generally gets 7 or 8 hours of sleep per night, and states that she has generally restful sleep. Snoring is not present. Apneic episodes is not present. Epworth Sleepiness Score is 11.   Shortness of Breath Anita Caldwell notes increasing shortness of breath with exercising and seems to be worsening over time with weight gain. She notes getting out of breath sooner with activity than she used to. This has gotten worse recently. Anita Caldwell denies shortness of breath at rest or orthopnea.  Depression Screen Anita Caldwell Food and Mood (modified PHQ-9) score was 16. 15-19 moderate severe depression     05/30/2023    3:09 PM  Depression screen PHQ 2/9  Decreased Interest 0  Down, Depressed, Hopeless 0  PHQ - 2 Score 0     Assessment and Plan:   Other Fatigue Anita Caldwell does feel that her weight is causing her energy to be lower than it should be. Fatigue may be related to obesity, depression or many other causes. Labs will be ordered, and in the meanwhile, Anita Caldwell will focus on self care including making healthy food choices, increasing physical activity and focusing on stress reduction.  Shortness of Breath Anita Caldwell does not feel  that she gets out of breath more easily that she used to when she exercises. Sakara's shortness of breath appears to be obesity related and exercise induced. She has agreed to work on weight loss and gradually increase exercise to treat her exercise induced shortness of breath. Will continue to monitor closely.  Health Maintenance:    Obesity   Plan: Will do EKG, indirect calorimetry, and labs.     Vitamin D Deficiency Vitamin D is not at goal of 50.  Most recent vitamin D level was 21.5. She is at risk for vitamin D deficiency due to obesity.  She is on a MV.  Lab Results  Component Value Date   VD25OH 21.5 (L) 04/11/2021    Plan: Will check for vitamin D deficiency.   Mild binge eating disorder:  Anita Caldwell has had issues with stress eating, emotional eating, boredom eating, and binge eating. Currently this is poorly controlled. Overall mood is stable. Denies suicidal/homicidal ideation. Medication(s): no medications  Plan:  Specifically regarding patient's less desirable eating habits and patterns, we employed the technique of small changes when she cannot fully commit to her prudent nutritional plan. Will send a referral to Dr. Delaine Favorite, PhD psychologist to help with eating behaviors.  Discussed distractions to curb eating behaviors. Discussed activities to do with one's hands in the evening  Be sure to get adequate rest as lack of rest can trigger appetite.  Have plan in place for stressful events.  Consider other rewards besides food.    Migraines without aura.   She was diagnosed with  a history of migraines in 2023 and is well controlled.  She is currently taking the following medications: Imitrex as needed.  She is not seeing a neurologist.    Plan:  Discussed the benefit that losing weight may on the frequency and intensity of migraines.  Will continue medications.  Will avoid trigger substances and food.  Will practice good sleep hygiene.  Discussed the importance of exercise.  Discussed the vitamin D supplementation may reduce migraine status.    Elevated glucose:  She has a family history of diabetes on the paternal side.  She currently does not have a diagnosis of prediabetes or diabetes.  Plan:   Will check her hemoglobin A1c and insulin levels. Will begin food plan and  exercise.   Previous labs reviewed today. No recent labs.   Labs done today CMP, Lipids, Insulin, HgbA1c, Vit D, Vit B12, and Thyroid Panel   Generalized Obesity: BMI (Calculated): 33.09   Anita Caldwell is currently in the action stage of change and her goal is to begin weight loss efforts. I recommend Anita Caldwell begin the structured treatment plan as follows:  She has agreed to Category 2 Plan plus 100 calories  Exercise goals: All adults should avoid inactivity. Some activity is better than none, and adults who participate in any amount of physical activity, gain some health benefits.  Behavioral modification strategies:increasing lean protein intake, increasing vegetables, increase H2O intake, decrease ETOH, increase high fiber foods, no skipping meals, meal planning and cooking strategies, keeping healthy foods in the home, better snacking choices, and planning for success  She was informed of the importance of frequent follow-up visits to maximize her success with intensive lifestyle modifications for her multiple health conditions. She was informed we would discuss her lab results at her next visit unless there is a critical issue that needs to be addressed sooner. Anita Caldwell agreed to keep her next visit at the agreed upon time to discuss  these results.  Objective:  General: Cooperative, alert, well developed, in no acute distress. HEENT: Conjunctivae and lids unremarkable. Cardiovascular: Regular rhythm.  Lungs: Normal work of breathing. Neurologic: No focal deficits.   Lab Results  Component Value Date   CREATININE 0.70 06/08/2021   BUN 12 06/08/2021   NA 141 06/08/2021   K 4.3 06/08/2021   CL 107 (H) 06/08/2021   CO2 20 06/08/2021   Lab Results  Component Value Date   ALT 14 04/11/2021   AST 14 04/11/2021   ALKPHOS 74 04/11/2021   BILITOT <0.2 04/11/2021   Lab Results  Component Value Date   HGBA1C 5.3 04/11/2021   No results found for: "INSULIN" Lab Results  Component  Value Date   TSH 1.640 04/11/2021   Lab Results  Component Value Date   CHOL 186 (H) 04/11/2021   HDL 52 04/11/2021   LDLCALC 118 (H) 04/11/2021   TRIG 85 04/11/2021   CHOLHDL 3.6 04/11/2021   Lab Results  Component Value Date   WBC 5.3 05/29/2022   HGB 13.4 05/29/2022   HCT 42.2 05/29/2022   MCV 84 05/29/2022   PLT 299 05/29/2022   No results found for: "IRON", "TIBC", "FERRITIN"  Attestation Statements:  Applicable history such as the following:  allergies, medications, problem list, medical history, surgical history, family history, social history, and previous encounter notes reviewed by clinician on day of visit:  Time spent on visit including the items listed below was 43 minutes.  -preparing to see the patient (e.g., review of tests, history, previous notes) -obtaining and/or reviewing separately obtained history -counseling and educating the patient/family/caregiver -documenting clinical information in the electronic or other health record -ordering medications, tests, or procedures -independently interpreting results and communicating results to the patient/ family/caregiver -referring and communicating with other health care professionals  -care coordination   This may have been prepared with the assistance of Engineer, civil (consulting).  Occasional wrong-word or sound-a-like substitutions may have occurred due to the inherent limitations of voice recognition software.   Kirk Peper, DO

## 2023-06-19 ENCOUNTER — Encounter: Admitting: Family Medicine

## 2023-06-19 ENCOUNTER — Telehealth: Payer: Self-pay

## 2023-06-19 ENCOUNTER — Encounter: Payer: Self-pay | Admitting: Bariatrics

## 2023-06-19 LAB — COMPREHENSIVE METABOLIC PANEL WITH GFR
ALT: 11 IU/L (ref 0–32)
AST: 16 IU/L (ref 0–40)
Albumin: 4.4 g/dL (ref 4.0–5.0)
Alkaline Phosphatase: 74 IU/L (ref 44–121)
BUN/Creatinine Ratio: 16 (ref 9–23)
BUN: 12 mg/dL (ref 6–20)
Bilirubin Total: 0.4 mg/dL (ref 0.0–1.2)
CO2: 20 mmol/L (ref 20–29)
Calcium: 9.1 mg/dL (ref 8.7–10.2)
Chloride: 104 mmol/L (ref 96–106)
Creatinine, Ser: 0.77 mg/dL (ref 0.57–1.00)
Globulin, Total: 2 g/dL (ref 1.5–4.5)
Glucose: 88 mg/dL (ref 70–99)
Potassium: 4.7 mmol/L (ref 3.5–5.2)
Sodium: 137 mmol/L (ref 134–144)
Total Protein: 6.4 g/dL (ref 6.0–8.5)
eGFR: 112 mL/min/{1.73_m2} (ref >59)

## 2023-06-19 LAB — LIPID PANEL WITH LDL/HDL RATIO
Cholesterol, Total: 169 mg/dL (ref 100–199)
HDL: 42 mg/dL (ref >39)
LDL Chol Calc (NIH): 112 mg/dL — ABNORMAL HIGH (ref 0–99)
LDL/HDL Ratio: 2.7 ratio (ref 0.0–3.2)
Triglycerides: 82 mg/dL (ref 0–149)
VLDL Cholesterol Cal: 15 mg/dL (ref 5–40)

## 2023-06-19 LAB — VITAMIN D 25 HYDROXY (VIT D DEFICIENCY, FRACTURES): Vit D, 25-Hydroxy: 27.7 ng/mL — ABNORMAL LOW (ref 30.0–100.0)

## 2023-06-19 LAB — TSH+T4F+T3FREE
Free T4: 1.11 ng/dL (ref 0.82–1.77)
T3, Free: 3.1 pg/mL (ref 2.0–4.4)
TSH: 2.21 u[IU]/mL (ref 0.450–4.500)

## 2023-06-19 LAB — INSULIN, RANDOM: INSULIN: 17.8 u[IU]/mL (ref 2.6–24.9)

## 2023-06-19 LAB — HEMOGLOBIN A1C
Est. average glucose Bld gHb Est-mCnc: 100 mg/dL
Hgb A1c MFr Bld: 5.1 % (ref 4.8–5.6)

## 2023-06-19 LAB — VITAMIN B12: Vitamin B-12: 328 pg/mL (ref 232–1245)

## 2023-06-19 NOTE — Telephone Encounter (Signed)
 Patient stated she has not had any kidney stones or seizures in the past. She is currently not taking her birth control pills but plan on restarting them on Sunday. She is willing to try what medication Dr. Bevin Bucks thinks is ok.

## 2023-06-19 NOTE — Telephone Encounter (Signed)
-----   Message from Kirk Peper sent at 06/19/2023  2:00 PM EDT ----- Call pt. Has she ever had any kidney stones or seizures? Also what birth control is she using? Does she have any allergies to any medication? We could consider either Topamax or Wellbutrin, but she would need a reliable form of birth control as these medications can cause birth defects.

## 2023-06-20 ENCOUNTER — Other Ambulatory Visit: Payer: Self-pay | Admitting: Bariatrics

## 2023-06-20 MED ORDER — BUPROPION HCL ER (SR) 150 MG PO TB12: 150.0000 mg | ORAL_TABLET | Freq: Every day | ORAL | 0 refills | Status: DC

## 2023-06-27 ENCOUNTER — Telehealth: Admitting: Family Medicine

## 2023-07-05 ENCOUNTER — Ambulatory Visit: Admitting: Bariatrics

## 2023-07-18 ENCOUNTER — Encounter: Payer: Self-pay | Admitting: Bariatrics

## 2023-07-18 ENCOUNTER — Ambulatory Visit (INDEPENDENT_AMBULATORY_CARE_PROVIDER_SITE_OTHER): Admitting: Bariatrics

## 2023-07-18 VITALS — BP 106/72 | HR 83 | Temp 97.9°F | Ht 65.5 in | Wt 200.0 lb

## 2023-07-18 DIAGNOSIS — F5081 Binge eating disorder, mild: Secondary | ICD-10-CM | POA: Diagnosis not present

## 2023-07-18 DIAGNOSIS — E88819 Insulin resistance, unspecified: Secondary | ICD-10-CM | POA: Diagnosis not present

## 2023-07-18 DIAGNOSIS — R632 Polyphagia: Secondary | ICD-10-CM

## 2023-07-18 DIAGNOSIS — E669 Obesity, unspecified: Secondary | ICD-10-CM | POA: Diagnosis not present

## 2023-07-18 DIAGNOSIS — Z6832 Body mass index (BMI) 32.0-32.9, adult: Secondary | ICD-10-CM

## 2023-07-18 MED ORDER — VITAMIN D (ERGOCALCIFEROL) 1.25 MG (50000 UNIT) PO CAPS: 50000.0000 [IU] | ORAL_CAPSULE | ORAL | 0 refills | Status: DC

## 2023-07-18 MED ORDER — ZEPBOUND 2.5 MG/0.5ML ~~LOC~~ SOAJ: 2.5000 mg | SUBCUTANEOUS | 0 refills | Status: DC

## 2023-07-18 NOTE — Progress Notes (Signed)
 First follow-up after initial visit.        WEIGHT SUMMARY AND BIOMETRICS  Weight Lost Since Last Visit: 2lb  Weight Gained Since Last Visit: 0   Vitals Temp: 97.9 F (36.6 C) BP: 106/72 Pulse Rate: 83 SpO2: 99 %   Anthropometric Measurements Height: 5' 5.5" (1.664 m) Weight: 200 lb (90.7 kg) BMI (Calculated): 32.76 Weight at Last Visit: 202lb Weight Lost Since Last Visit: 2lb Weight Gained Since Last Visit: 0 Starting Weight: 202lb Total Weight Loss (lbs): 2 lb (0.907 kg) Peak Weight: 212lb   Body Composition  Body Fat %: 37.6 % Fat Mass (lbs): 75.2 lbs Muscle Mass (lbs): 118.4 lbs Total Body Water (lbs): 83 lbs Visceral Fat Rating : 6   Other Clinical Data Fasting: no Labs: no Today's Visit #: 2 Starting Date: 06/18/23    OBESITY Anita Caldwell is here to discuss her progress with her obesity treatment plan along with follow-up of her obesity related diagnoses.    Nutrition Plan: the Category 2 plan + 100 calories - 100% adherence.  Current exercise: walking  Interim History:  She is down 2 lbs since her last visit.  Eating all of the food on the plan., Protein intake is as prescribed, Is exceeding snack calorie allotment, Is not skipping meals, Not journaling consistently., and Reports excessive cravings.  Initial positives regarding the dietary plan: She states that she does pretty well with the plan especially during the day but is doing some stress eating perhaps some binge eating in the evening. Initial challenges regarding  the dietary plan: She is still having a lot of cravings despite being on Wellbutrin .  She had been on Vyvanse  in the past but it was not helpful.   Pharmacotherapy: Anita Caldwell is on Wellbutrin  150 mg daily. Adverse side effects: Slightly more anxious Hunger is moderately controlled.  Cravings are poorly controlled.   Assessment/Plan:   Mild binge eating:  Anita Caldwell has had issues with stress eating, emotional eating, boredom eating, and binge eating. Currently this is poorly controlled. Overall mood is stable. Denies suicidal/homicidal ideation. She has taken Vyvanse  in the past with minimal to no assistance with cravings. Medication(s): Wellbutrin  150 mg daily in the am  Plan:  Motivational interviewing as well as evidence-based interventions for health behavior change were utilized today including the discussion of self monitoring techniques, problem-solving barriers and SMART goal setting techniques.  Put in a referral to Dr. Delaine Favorite, PhD psychologist to help with eating behaviors.  Discussed distractions to curb eating behaviors. Discussed activities to do with one's hands in the evening  Be sure to get adequate rest as lack of rest can trigger appetite.  Have plan in place for stressful events.  Consider other rewards besides food.   Will continue her Wellbutrin  150 mg 1 daily in the AM. Discussed other options for disordered eating (see below).    Polyphagia Anita Caldwell endorses excessive hunger.  Medication(s): none Appetite:  moderately controlled. Cravings are poorly controlled.   Plan: Medication(s): Anita Caldwell 2.5 mg SQ weekly Will increase water, protein and fiber to help assuage hunger.  Will minimize foods that have a high glucose index/load to minimize reactive hypoglycemia.   Anita Caldwell denies personal or family history of thyroid cancer, history of pancreatitis, or current cholelithiasis. Anita Caldwell was informed of the most common side effects (nausea, constipation, diarrhea).   Patient informed to watch for possible symptoms, such as a lump or swelling in the neck, hoarseness, trouble swallowing, or shortness of breath. If you have any of these  symptoms, tell your healthcare provide.   She is aware that she needs to use reliable birth control at all times while taking a GLP-1. Informed that she  is to use a back-up method for the first month and then again for 1 month if the dosage is increased.   She has been placed on a 500 calorie deficit diet.  She has been advised to exercise at least 150 minutes per week, both cardio and resistance.    We also discussed other medication options if her insurance does not cover Anita Caldwell such as Wegovy, Contrave, Qsymia, and a combination of over off-label medications.  We also discussed Qsymia in detail and a handout was given.  If her insurance does not cover the bound then we will consider Qsymia.  Discussed absolute and relative contraindications.   Generalized Obesity: Current BMI BMI (Calculated): 32.76   Pharmacotherapy Plan Start  Anita Caldwell 2.5 mg SQ weekly  Anita Caldwell is currently in the action stage of change. As such, her goal is to continue with weight loss efforts.  She has agreed to the Category 2 plan plus 100 calories.   Exercise goals: All adults should avoid inactivity. Some physical activity is better than none, and adults who participate in any amount of physical activity gain some health benefits.  Behavioral modification strategies: increasing lean protein intake, decreasing simple carbohydrates , no meal skipping, decrease eating out, meal planning , increase water intake, better snacking choices, planning for success, increasing vegetables, decrease snacking , avoiding temptations, and mindful eating.  Anita Caldwell has agreed to follow-up with our clinic in 2 weeks.   Labs reviewed today from last visit on 06/18/23 (CMP, Lipids, HgbA1c, insulin , vitamin D , B 12, and thyroid panel).   Objective:   VITALS: Per patient if applicable, see vitals. GENERAL: Alert and in no acute distress. CARDIOPULMONARY: No increased WOB. Speaking in clear sentences.  PSYCH: Pleasant and cooperative. Speech normal rate and rhythm. Affect is appropriate. Insight and judgement are appropriate. Attention is focused, linear, and appropriate.  NEURO:  Oriented as arrived to appointment on time with no prompting.   Attestation Statements:   This was prepared with the assistance of Engineer, civil (consulting).  Occasional wrong-word or sound-a-like substitutions may have occurred due to the inherent limitations of voice recognition software.   Kirk Peper, DO

## 2023-07-19 ENCOUNTER — Telehealth: Payer: Self-pay

## 2023-07-19 NOTE — Telephone Encounter (Signed)
 Started PA for Zepbound via covermymeds

## 2023-07-23 ENCOUNTER — Other Ambulatory Visit: Payer: Self-pay | Admitting: Bariatrics

## 2023-07-23 MED ORDER — WEGOVY 0.25 MG/0.5ML ~~LOC~~ SOAJ: 0.2500 mg | SUBCUTANEOUS | 0 refills | Status: DC

## 2023-07-23 MED ORDER — PHENTERMINE HCL 30 MG PO CAPS: 30.0000 mg | ORAL_CAPSULE | ORAL | 0 refills | Status: DC

## 2023-07-23 NOTE — Telephone Encounter (Signed)
 Spoke to patient and notified her Dr. Bevin Bucks recommendations. Patient verbalized understanding and stated she would like to try Phentermine .

## 2023-07-23 NOTE — Telephone Encounter (Signed)
 Zepbound  denied due to patient needing to try and fail Wegovy 

## 2023-07-25 ENCOUNTER — Other Ambulatory Visit: Payer: Self-pay | Admitting: Bariatrics

## 2023-07-25 ENCOUNTER — Telehealth: Payer: Self-pay | Admitting: Bariatrics

## 2023-07-25 MED ORDER — WEGOVY 0.25 MG/0.5ML ~~LOC~~ SOAJ: 0.2500 mg | SUBCUTANEOUS | 0 refills | Status: DC

## 2023-07-25 NOTE — Telephone Encounter (Signed)
 Started PA for [employer] via covermymeds

## 2023-07-25 NOTE — Telephone Encounter (Signed)
 Pt is calling because she will like to try the Wegovy  injection instead of phentermine . Please advise

## 2023-07-26 NOTE — Telephone Encounter (Signed)
 PA for Wegovy  0.25 has been denied. PA is now complete.

## 2023-07-31 NOTE — Telephone Encounter (Signed)
 Chart notes have been added to the prior authorization for her Wegovy . It was resubmitted through cover my meds. Waiting on determination.

## 2023-08-01 NOTE — Telephone Encounter (Signed)
 PA for Wegovy  0.25 has been approved. PA is now complete.      Your prior authorization for Wegovy  has been approved! More Info Personalized support and financial assistance may be available through the [employer] program. For more information, and to see program requirements, click on the More Info button to the right.  Message from plan: PA Case: [id number], Status: Approved, Coverage Starts on: 07/31/2023 12:00:00 AM, Coverage Ends on: 01/27/2024 12:00:00 AM.. Authorization Expiration Date: January 27, 2024.

## 2023-08-01 NOTE — Telephone Encounter (Signed)
 Wegovy  approved from 07/31/2023-01/27/2024.

## 2023-08-07 ENCOUNTER — Ambulatory Visit: Admitting: Family Medicine

## 2023-08-09 ENCOUNTER — Ambulatory Visit: Admitting: Obstetrics and Gynecology

## 2023-08-09 NOTE — Progress Notes (Deleted)
 Saw Dr. Cipriano Creeks in April. Started back on Junel  1/20  Tried Junel  with me- reported weight  gain. Started on yaz. Saw [facility] and now back to junel ?  Had normal US  in March - small left corpus luteum  Normal pap 2024

## 2023-08-15 ENCOUNTER — Encounter: Payer: Self-pay | Admitting: Obstetrics and Gynecology

## 2023-08-15 ENCOUNTER — Ambulatory Visit (INDEPENDENT_AMBULATORY_CARE_PROVIDER_SITE_OTHER): Admitting: Obstetrics and Gynecology

## 2023-08-15 VITALS — BP 114/78 | HR 68 | Ht 65.0 in | Wt 191.0 lb

## 2023-08-15 DIAGNOSIS — Z8742 Personal history of other diseases of the female genital tract: Secondary | ICD-10-CM | POA: Diagnosis not present

## 2023-08-15 DIAGNOSIS — N946 Dysmenorrhea, unspecified: Secondary | ICD-10-CM

## 2023-08-15 NOTE — Progress Notes (Signed)
 GYNECOLOGY OFFICE VISIT NOTE  History:  Discussed the use of AI scribe software for clinical note transcription with the patient, who gave verbal consent to proceed.  History of Present Illness Anita Caldwell is a 23 year old female with ovarian cysts who presents with left-sided abdominal pain and menstrual irregularities.  She experiences left-sided abdominal pain associated with her history of ovarian cysts. The pain fluctuates in intensity, with some months being manageable and others severe enough to cause difficulty walking. The pain has been persistent since last year and has worsened since January.  Her menstrual periods are extremely painful, with severity varying from month to month. Since January, her periods have been particularly problematic, with heavy bleeding and clotting. She has tried birth control in the past, including Junel  and Yaz, but experienced adverse effects such as moodiness and increased bleeding, respectively. She discontinued Yaz due to excessively heavy bleeding.  An ultrasound performed last year showed no cysts. She has a history of cysts that have grown and resolved without surgical intervention.  She has gained weight over the years but has lost 20 pounds since February, noticing a slight improvement in her symptoms. She mentions that her periods were less severe when she was thinner.  She does not currently take any medication for her menstrual pain but has used Medrol  and Pamperin in the past without significant relief. Heat application, such as hot showers, provides some pain relief.  During her periods, she experiences heavy bleeding with clots, requiring the use of regular pads. The bleeding is not excessively frequent, as she can go an hour without needing to change pads. Clots are typically the size of a quarter, and she recalls larger clots during a suspected early pregnancy loss.  She avoids using tampons due to pain and uses regular pads during  menstruation.     Past Medical History:  Diagnosis Date   ADD (attention deficit disorder)    Anxiety    Depression    GERD (gastroesophageal reflux disease)    Vitamin D  deficiency     History reviewed. No pertinent surgical history.  The following portions of the patient's history were reviewed and updated as appropriate: allergies, current medications, past family history, past medical history, past social history, past surgical history and problem list.   Health Maintenance:   Diagnosis  Date Value Ref Range Status  04/19/2023   Final   - Negative for intraepithelial lesion or malignancy (NILM)    Review of Systems:  Pertinent items noted in HPI and remainder of comprehensive ROS otherwise negative.  Physical Exam:  BP 114/78   Pulse 68   Ht 5' 5 (1.651 m)   Wt 191 lb (86.6 kg)   LMP 08/10/2023 (Exact Date)   BMI 31.78 kg/m  CONSTITUTIONAL: Well-developed, well-nourished female in no acute distress.  HEENT:  Normocephalic, atraumatic. External right and left ear normal. No scleral icterus.  NECK: Normal range of motion, supple, no masses noted on observation SKIN: No rash noted. Not diaphoretic. No erythema. No pallor. MUSCULOSKELETAL: Normal range of motion. No edema noted. NEUROLOGIC: Alert and oriented to person, place, and time. Normal muscle tone coordination. No cranial nerve deficit noted. PSYCHIATRIC: Normal mood and affect. Normal behavior. Normal judgment and thought content.  PELVIC: Deferred  Labs and Imaging No results found for this or any previous visit (from the past week). No results found.  Assessment and Plan:  Assessment and Plan Assessment & Plan Dysmenorrhea and Menorrhagia Severe dysmenorrhea and menorrhagia exacerbated by  Yaz. Ovarian cysts resolved. Prefers non-surgical management. Ibuprofen vs tranexamic acid discussed for symptom control. - Order pelvic ultrasound to check for ovarian cysts. - Recommend ibuprofen 800 mg every 8  hours during menstruation. - Discuss tranexamic acid as an alternative to ibuprofen. - Encourage continued weight loss and dietary changes.  Ovarian Cysts Previous ovarian cysts resolved without surgery. Concern for recurrence due to symptoms. Surgery not indicated unless significant growth or severe symptoms. - Order pelvic ultrasound to evaluate for new or recurrent ovarian cysts.  General Health Maintenance Weight loss of 20 pounds since February. Discussed impact of weight and diet on menstrual symptoms and health. - Encourage continued weight loss and dietary changes. - Advise on reducing exposure to potential hormone disruptors.    Routine preventative health maintenance measures emphasized. Please refer to After Visit Summary for other counseling recommendations.   Return if symptoms worsen or fail to improve.  Lacey Pian, MD, FACOG Obstetrician & Gynecologist, [facility] for [facility], [facility]

## 2023-08-20 ENCOUNTER — Telehealth (INDEPENDENT_AMBULATORY_CARE_PROVIDER_SITE_OTHER): Admitting: Psychology

## 2023-08-20 ENCOUNTER — Telehealth (INDEPENDENT_AMBULATORY_CARE_PROVIDER_SITE_OTHER): Payer: Self-pay | Admitting: Psychology

## 2023-08-20 NOTE — Progress Notes (Unsigned)
 Office: [phone]  /  Fax: [phone]    Date: August 20, 2023    Appointment Start Time: *** Duration: *** minutes Provider: Catherene Close, Psy.D. Type of Session: Intake for Individual Therapy  Location of Patient: {gbptloc:23249} (private location) Location of Provider: Provider's home (private office) Type of Contact: Telepsychological Visit via MyChart Video Visit  Informed Consent:This provider called Alisa App at 3:04pm as she did not present for today's appointment. A HIPAA compliant voicemail was left requesting a call back. She was observed joining shortly after. As such, today's appointment was initiated *** minutes late. Prior to proceeding with today's appointment, two pieces of identifying information were obtained. In addition, Saron's physical location at the time of this appointment was obtained as well a phone number she could be reached at in the event of technical difficulties. Lucia and this provider participated in today's telepsychological service.   The provider's role was explained to [employer]. The provider reviewed and discussed issues of confidentiality, privacy, and limits therein (e.g., reporting obligations). In addition to verbal informed consent, written informed consent for psychological services was obtained prior to the initial appointment. Since the clinic is not a 24/7 crisis center, mental health emergency resources were shared and this  provider explained MyChart, e-mail, voicemail, and/or other messaging systems should be utilized only for non-emergency reasons. This provider also explained that information obtained during appointments will be placed in Jalexia's medical record and relevant information will be shared with other providers at [facility] at any locations for coordination of care. Shalae agreed information may be shared with other [facility] providers as needed for coordination of care and by signing the service  agreement document, she provided written consent for coordination of care. Prior to initiating telepsychological services, Kamyah completed an informed consent document, which included the development of a safety plan (i.e., an emergency contact and emergency resources) in the event of an emergency/crisis. Celise verbally acknowledged understanding she is ultimately responsible for understanding her insurance benefits for telepsychological and in-person services. This provider also reviewed confidentiality, as it relates to telepsychological services. Madiline  acknowledged understanding that appointments cannot be recorded without both party consent and she is aware she is responsible for securing confidentiality on her end of the session. Aarvi verbally consented to proceed.  Chief Complaint/HPI: Charika was referred by Dr. Kirk Peper on 07/18/2023 due to mild binge eating. Per the note for the OV, Meryn has had issues with stress eating, emotional eating, boredom eating, and binge eating. Currently this is poorly controlled. Overall mood is stable. Denies suicidal/homicidal ideation. She has taken Vyvanse  in the past with minimal to no assistance with cravings. Medication(s): Wellbutrin  150 mg daily in the am.  During today's appointment, Kriss was verbally administered a questionnaire assessing various behaviors related to emotional eating behaviors. Tajana endorsed the following: {gbmoodandfood:21755}. She shared she craves ***. Cerenity believes the onset of emotional eating behaviors was *** and described the current frequency of emotional eating behaviors as ***. In addition, Analiyah {gblegal:22371} a history of binge eating behaviors. *** Currently, Ajai indicated ***. Furthermore, Gardenia {gblegal:22371} other problems of concern. ***   Mental Status Examination:  Appearance: {Appearance:22431} Behavior: {Behavior:22445} Mood: {gbmood:21757} Affect: {Affect:22436} Speech: {Speech:22432} Eye  Contact: {Eye Contact:22433} Psychomotor Activity: {Motor Activity:22434} Gait: unable to assess  Thought Process: {thought process:22448}  Thought Content/Perception: {disturbances:22451} Orientation: {Orientation:22437} Memory/Concentration: {gbcognition:22449} Insight/Judgment: {Insight:22446}  Family & Psychosocial History: Tashaya reported she is *** and ***. She indicated she is currently ***.  Additionally, Keani shared her highest level of education obtained is ***. Currently, Maya's social support system consists of her ***. Moreover, Yoshiye stated she resides with her ***.   Medical History:  Past Medical History:  Diagnosis Date   ADD (attention deficit disorder)    Anxiety    Depression    GERD (gastroesophageal reflux disease)    Vitamin D  deficiency    No past surgical history on file. Current Outpatient Medications on File Prior to Visit  Medication Sig Dispense Refill   buPROPion  (WELLBUTRIN  SR) 150 MG 12 hr tablet Take 1 tablet (150 mg total) by mouth daily. (Patient not taking: Reported on 08/15/2023) 30 tablet 0   lisdexamfetamine (VYVANSE ) 10 MG capsule Take 1-2 capsules (10-20 mg total) by mouth daily. (Patient not taking: Reported on 08/15/2023) 60 capsule 0   Semaglutide -Weight Management (WEGOVY ) 0.25 MG/0.5ML SOAJ Inject 0.25 mg into the skin once a week. 2 mL 0   SUMAtriptan  (IMITREX ) 25 MG tablet Take 1 tablet (25 mg total) by mouth once for 1 dose. May repeat in 2 hours if headache persists or recurs. 10 tablet 0   SUMAtriptan  (IMITREX ) 25 MG tablet Take 25 mg by mouth every 2 (two) hours as needed for migraine. May repeat in 2 hours if headache persists or recurs.     Vitamin D , Ergocalciferol , (DRISDOL ) 1.25 MG (50000 UNIT) CAPS capsule Take 1 capsule (50,000 Units total) by mouth every 7 (seven) days. 5 capsule 0   No current facility-administered medications on file prior to visit.    Mental Health History: Nika reported ***. She {gblegal:22371} a  history of psychotropic medications. Cylah {Endorse or deny of item:23407} hospitalizations for psychiatric concerns. She {gblegal:22371} a family history of mental health/substance abuse related concerns. *** Takoda {Endorse or deny of item:23407} trauma including {gbtrauma:22071} abuse, as well as neglect. ***  Aislin described her typical mood lately as ***. Aside from concerns noted above and endorsed on the PHQ-9 and GAD-7, Ioma reported ***. Shaylen {gblegal:22371} current alcohol use. *** She {gblegal:22371} tobacco use. *** She {gblegal:22371} illicit/recreational substance use. Furthermore, Vernia indicated she is not experiencing the following: {gbsxs:21965}. She also denied history of and current suicidal ideation, plan, and intent; history of and current homicidal ideation, plan, and intent; and history of and current engagement in self-harm.  Legal History: Mykal {Endorse or deny of item:23407} legal involvement.   Structured Assessments Results: The Patient Health Questionnaire-9 (PHQ-9) is a self-report measure that assesses symptoms and severity of depression over the course of the last two weeks. Shalonda obtained a score of *** suggesting {GBPHQ9SEVERITY:21752}. Latayvia finds the endorsed symptoms to be {gbphq9difficulty:21754}. [0= Not at all; 1= Several days; 2= More than half the days; 3= Nearly every day] Little interest or pleasure in doing things ***  Feeling down, depressed, or hopeless ***  Trouble falling or staying asleep, or sleeping too much ***  Feeling tired or having little energy ***  Poor appetite or overeating ***  Feeling bad about yourself --- or that you are a failure or have let yourself or your family down ***  Trouble concentrating on things, such as reading the newspaper or watching television ***  Moving or speaking so slowly that other people could have noticed? Or the opposite --- being so fidgety or restless that you have been moving around a lot more  than usual ***  Thoughts that you would be better off dead or hurting yourself in some way ***  PHQ-9 Score ***  The Generalized Anxiety Disorder-7 (GAD-7) is a brief self-report measure that assesses symptoms of anxiety over the course of the last two weeks. Inza obtained a score of *** suggesting {gbgad7severity:21753}. Naveh finds the endorsed symptoms to be {gbphq9difficulty:21754}. [0= Not at all; 1= Several days; 2= Over half the days; 3= Nearly every day] Feeling nervous, anxious, on edge ***  Not being able to stop or control worrying ***  Worrying too much about different things ***  Trouble relaxing ***  Being so restless that it's hard to sit still ***  Becoming easily annoyed or irritable ***  Feeling afraid as if something awful might happen ***  GAD-7 Score ***   Interventions:  {Interventions List for Intake:23406}  Diagnostic Impressions & Provisional DSM-5 Diagnosis(es): Lorena endorsed a history of engagement in emotional eating behaviors and noted the onset as ***. She described the current frequency as ***. Jaidee denied engagement in any other disordered eating behaviors. Based on the aforementioned, the following diagnosis(es) were assigned: {Diagnoses:22752}.*** Given the limited scope of this appointment and this provider's role with the clinic, the following diagnosis(es) were assigned: {Diagnoses:22752}.  Plan: Kimberlly appears able and willing to participate as evidenced by engagement in reciprocal conversation and asking questions as needed for clarification. The next appointment is scheduled for *** at ***, which will be via MyChart Video Visit. The following treatment goal was established: increase coping skills. This provider will regularly review the treatment plan and medical chart to keep informed of status changes. Giara expressed understanding and agreement with the initial treatment plan of care.   Evalynn will be sent a handout via e-mail to utilize  between now and the next appointment to increase awareness of hunger patterns and subsequent eating. Krysti provided verbal consent during today's appointment for this provider to send the handout via e-mail. ***   Catherene Close, PsyD

## 2023-08-20 NOTE — Telephone Encounter (Signed)
  Office: [phone]  /  Fax: [phone]  Date of Call: August 20, 2023  Provider: Catherene Close, PsyD  CONTENT: This provider called Miaya to check-in as she did not present for today's MyChart Video Visit appointment. A HIPAA compliant voicemail was left requesting a call back. Of note, this provider stayed on the MyChart Video Visit appointment for 5 minutes prior to signing off per the clinic's grace period policy.    PLAN: This provider will wait for Shanna to call back. No further follow-up planned by this provider.

## 2023-08-21 ENCOUNTER — Ambulatory Visit (INDEPENDENT_AMBULATORY_CARE_PROVIDER_SITE_OTHER): Admitting: Bariatrics

## 2023-08-21 ENCOUNTER — Encounter: Payer: Self-pay | Admitting: Bariatrics

## 2023-08-21 VITALS — BP 110/76 | HR 81 | Temp 97.7°F | Ht 65.5 in | Wt 189.0 lb

## 2023-08-21 DIAGNOSIS — Z6830 Body mass index (BMI) 30.0-30.9, adult: Secondary | ICD-10-CM | POA: Diagnosis not present

## 2023-08-21 DIAGNOSIS — E669 Obesity, unspecified: Secondary | ICD-10-CM

## 2023-08-21 DIAGNOSIS — E559 Vitamin D deficiency, unspecified: Secondary | ICD-10-CM | POA: Diagnosis not present

## 2023-08-21 DIAGNOSIS — R632 Polyphagia: Secondary | ICD-10-CM

## 2023-08-21 DIAGNOSIS — E6609 Other obesity due to excess calories: Secondary | ICD-10-CM

## 2023-08-21 MED ORDER — WEGOVY 0.5 MG/0.5ML ~~LOC~~ SOAJ: 0.5000 mg | SUBCUTANEOUS | 0 refills | Status: DC

## 2023-08-21 MED ORDER — ONDANSETRON HCL 4 MG PO TABS: 4.0000 mg | ORAL_TABLET | Freq: Three times a day (TID) | ORAL | 0 refills | Status: DC | PRN

## 2023-08-21 MED ORDER — VITAMIN D (ERGOCALCIFEROL) 1.25 MG (50000 UNIT) PO CAPS
50000.0000 [IU] | ORAL_CAPSULE | ORAL | 0 refills | Status: DC
Start: 2023-08-21 — End: 2023-09-13

## 2023-08-21 NOTE — Progress Notes (Signed)
 WEIGHT SUMMARY AND BIOMETRICS  Weight Lost Since Last Visit: 11lb  Weight Gained Since Last Visit: 0   Vitals Temp: 97.7 F (36.5 C) BP: 110/76 Pulse Rate: 81 SpO2: 100 %   Anthropometric Measurements Height: 5' 5.5 (1.664 m) Weight: 189 lb (85.7 kg) BMI (Calculated): 30.96 Weight at Last Visit: 200lb Weight Lost Since Last Visit: 11lb Weight Gained Since Last Visit: 0 Starting Weight: 202lb Total Weight Loss (lbs): 12 lb (5.443 kg) Peak Weight: 212lb   Body Composition  Body Fat %: 31.3 % Fat Mass (lbs): 59.2 lbs Muscle Mass (lbs): 123.6 lbs Total Body Water (lbs): 80.4 lbs Visceral Fat Rating : 4   Other Clinical Data Fasting: no Labs: no Today's Visit #: 3 Starting Date: 06/18/23    OBESITY Ky is here to discuss her progress with her obesity treatment plan along with follow-up of her obesity related diagnoses.    Nutrition Plan: the Category 2 plan + 100 calories- 50% adherence.  Current exercise: walking  Interim History:  She is down 11 lbs since her last visit.  Eating all of the food on the plan., Protein intake is as prescribed, Is not skipping meals, Water intake is adequate., and Reports polyphagia   Pharmacotherapy: Kahlen is on Wegovy  0.25 mg SQ weekly Adverse side effects: Nausea , mild 1st and 2nd day.  Hunger is moderately controlled.  Cravings are moderately controlled.  Assessment/Plan:   Charlott Calvario endorses excessive hunger.  Medication(s): Wegovy   Effects of medication (appetite):  moderately controlled. Cravings are moderately controlled.   Plan: Medication(s): Wegovy  0.50 mg SQ weekly Will increase water, protein and fiber to help assuage hunger.  Will minimize foods that have a high glucose index/load to minimize reactive hypoglycemia.   Vitamin D  Deficiency Vitamin D  is not at goal of 50.  Most  recent vitamin D  level was 27.7. She is on  prescription ergocalciferol  50,000 IU weekly. Lab Results  Component Value Date   VD25OH 27.7 (L) 06/18/2023   VD25OH 21.5 (L) 04/11/2021    Plan: Refill prescription vitamin D  50,000 IU weekly.      Generalized Obesity: Current BMI BMI (Calculated): 30.96   Pharmacotherapy Plan Continue and increase dose  Wegovy  0.50 mg SQ weekly  Kimberl is currently in the action stage of change. As such, her goal is to continue with weight loss efforts.  She has agreed to the Category 2 plan, keeping a food journal with goal of 1,300 calories and 80 to 90 grams of protein daily, and the pescatarian plan.  She can alternate between category 2 and pescatarian plan since they have the same amount of calories.  She can also ensures her journaling via My Fitness Pal if desired.  Exercise goals: All adults should avoid inactivity. Some physical activity is better than none, and adults who participate in any amount of physical activity gain some  health benefits.  Behavioral modification strategies: increasing lean protein intake, decreasing simple carbohydrates , meal planning , increase water intake, better snacking choices, increasing vegetables, increasing lower sugar fruits, decrease snacking , keep healthy foods in the home, weigh protein portions, and work on smaller portions.  Andee has agreed to follow-up with our clinic in 2 weeks.    Objective:   VITALS: Per patient if applicable, see vitals. GENERAL: Alert and in no acute distress. CARDIOPULMONARY: No increased WOB. Speaking in clear sentences.  PSYCH: Pleasant and cooperative. Speech normal rate and rhythm. Affect is appropriate. Insight and judgement are appropriate. Attention is focused, linear, and appropriate.  NEURO: Oriented as arrived to appointment on time with no prompting.   Attestation Statements:   This was prepared with the assistance of Engineer, civil (consulting).  Occasional  wrong-word or sound-a-like substitutions may have occurred due to the inherent limitations of voice recognition   Kirk Peper, DO

## 2023-08-24 ENCOUNTER — Ambulatory Visit

## 2023-08-24 DIAGNOSIS — Z8742 Personal history of other diseases of the female genital tract: Secondary | ICD-10-CM

## 2023-08-24 DIAGNOSIS — R1032 Left lower quadrant pain: Secondary | ICD-10-CM

## 2023-08-28 ENCOUNTER — Ambulatory Visit: Payer: Self-pay | Admitting: Obstetrics and Gynecology

## 2023-08-28 DIAGNOSIS — N83202 Unspecified ovarian cyst, left side: Secondary | ICD-10-CM

## 2023-09-13 ENCOUNTER — Ambulatory Visit: Admitting: Bariatrics

## 2023-09-13 ENCOUNTER — Encounter: Payer: Self-pay | Admitting: Bariatrics

## 2023-09-13 VITALS — BP 117/79 | HR 74 | Temp 98.0°F | Ht 65.5 in | Wt 181.0 lb

## 2023-09-13 DIAGNOSIS — E559 Vitamin D deficiency, unspecified: Secondary | ICD-10-CM

## 2023-09-13 DIAGNOSIS — Z6829 Body mass index (BMI) 29.0-29.9, adult: Secondary | ICD-10-CM

## 2023-09-13 DIAGNOSIS — R632 Polyphagia: Secondary | ICD-10-CM

## 2023-09-13 DIAGNOSIS — E669 Obesity, unspecified: Secondary | ICD-10-CM | POA: Diagnosis not present

## 2023-09-13 DIAGNOSIS — E66811 Obesity, class 1: Secondary | ICD-10-CM

## 2023-09-13 MED ORDER — WEGOVY 0.25 MG/0.5ML ~~LOC~~ SOAJ: 0.2500 mg | SUBCUTANEOUS | 0 refills | Status: DC

## 2023-09-13 MED ORDER — VITAMIN D (ERGOCALCIFEROL) 1.25 MG (50000 UNIT) PO CAPS: 50000.0000 [IU] | ORAL_CAPSULE | ORAL | 0 refills | Status: DC

## 2023-09-13 NOTE — Progress Notes (Signed)
 WEIGHT SUMMARY AND BIOMETRICS  Weight Lost Since Last Visit: 8lb  Weight Gained Since Last Visit: 0   Vitals Temp: 98 F (36.7 C) BP: 117/79 Pulse Rate: 74 SpO2: 99 %   Anthropometric Measurements Height: 5' 5.5 (1.664 m) Weight: 181 lb (82.1 kg) BMI (Calculated): 29.65 Weight at Last Visit: 189lb Weight Lost Since Last Visit: 8lb Weight Gained Since Last Visit: 0 Starting Weight: 202lb Total Weight Loss (lbs): 20 lb (9.072 kg) Peak Weight: 212lb   Body Composition  Body Fat %: 33.2 % Fat Mass (lbs): 60 lbs Muscle Mass (lbs): 114.8 lbs Total Body Water (lbs): 77.4 lbs Visceral Fat Rating : 4   Other Clinical Data Fasting: no Labs: no Today's Visit #: 4 Starting Date: 06/18/23    OBESITY Rayme is here to discuss her progress with her obesity treatment plan along with follow-up of her obesity related diagnoses.    Nutrition Plan: the Category 2 plan + 100-   Current exercise: walking and will start doing Pilates  Interim History:  She is down 8 lbs since her last visit. She did have marked nausea and vomiting with the Wegovy  at the higher dose and would like to go down on the dose.  Eating all of the food on the plan., Protein intake is as prescribed, Is not skipping meals, and Water intake is adequate.   Pharmacotherapy: Aeva is on Wegovy  0.25 mg SQ weekly Adverse side effects: nausea and vomiting.  Hunger is moderately controlled.  Cravings are moderately controlled.  Assessment/Plan:   Vitamin D  Deficiency Vitamin D  is not at goal of 50.  Most recent vitamin D  level was 27.7. She is on  prescription ergocalciferol  50,000 IU weekly. Lab Results  Component Value Date   VD25OH 27.7 (L) 06/18/2023   VD25OH 21.5 (L) 04/11/2021    Plan: Refill prescription vitamin D  50,000 IU weekly.   Polyphagia Arnetra endorses excessive hunger.   Medication(s): Wegovy   Effects of medication:  moderately controlled. Cravings are moderately controlled.   Plan: Medication(s): Wegovy  0.25 mg SQ weekly, decreased from Wegovy  0.5 mg SQ weekly due to nausea and vomiting may increase dose in the future Will increase water, protein and fiber to help assuage hunger.  Will minimize foods that have a high glucose index/load to minimize reactive hypoglycemia.    Generalized Obesity: Current BMI BMI (Calculated): 29.65   Pharmacotherapy Plan Continue and decrease dose  Wegovy  0.25 mg SQ weekly  Michel is currently in the action stage of change. As such, her goal is to continue with weight loss efforts.  She has agreed to the Category 2 plan, +100 cal and will add in anti-inflammatory foods.  Anti-inflammatory diet pyramid handout given.   Exercise goals: For substantial health benefits, adults should do at least 150 minutes (2 hours and 30 minutes) a week of moderate-intensity, or 75 minutes (1 hour and  15 minutes) a week of vigorous-intensity aerobic physical activity, or an equivalent combination of moderate- and vigorous-intensity aerobic activity. Aerobic activity should be performed in episodes of at least 10 minutes, and preferably, it should be spread throughout the week.  Behavioral modification strategies: increasing lean protein intake, no meal skipping, meal planning , increase water intake, better snacking choices, planning for success, keep healthy foods in the home, weigh protein portions, and mindful eating.  Dhara has agreed to follow-up with our clinic in 2 weeks.   Objective:   VITALS: Per patient if applicable, see vitals. GENERAL: Alert and in no acute distress. CARDIOPULMONARY: No increased WOB. Speaking in clear sentences.  PSYCH: Pleasant and cooperative. Speech normal rate and rhythm. Affect is appropriate. Insight and judgement are appropriate. Attention is focused, linear, and appropriate.  NEURO: Oriented as  arrived to appointment on time with no prompting.   Attestation Statements:   This was prepared with the assistance of Engineer, civil (consulting).  Occasional wrong-word or sound-a-like substitutions may have occurred due to the inherent limitations of voice recognition   Clayborne Daring, DO

## 2023-09-18 ENCOUNTER — Ambulatory Visit (INDEPENDENT_AMBULATORY_CARE_PROVIDER_SITE_OTHER): Admitting: Bariatrics

## 2023-09-18 ENCOUNTER — Encounter: Payer: Self-pay | Admitting: Bariatrics

## 2023-09-18 VITALS — BP 113/65 | HR 68 | Temp 97.4°F | Ht 65.5 in | Wt 178.0 lb

## 2023-09-18 DIAGNOSIS — R632 Polyphagia: Secondary | ICD-10-CM

## 2023-09-18 DIAGNOSIS — E669 Obesity, unspecified: Secondary | ICD-10-CM

## 2023-09-18 DIAGNOSIS — R112 Nausea with vomiting, unspecified: Secondary | ICD-10-CM

## 2023-09-18 DIAGNOSIS — Z6829 Body mass index (BMI) 29.0-29.9, adult: Secondary | ICD-10-CM | POA: Diagnosis not present

## 2023-09-18 LAB — POCT URINE PREGNANCY: Preg Test, Ur: NEGATIVE

## 2023-09-18 NOTE — Progress Notes (Signed)
 WEIGHT SUMMARY AND BIOMETRICS  Weight Lost Since Last Visit: 3lb  Weight Gained Since Last Visit: 0   Vitals Temp: (!) 97.4 F (36.3 C) BP: 113/65 Pulse Rate: 68 SpO2: 100 %   Anthropometric Measurements Height: 5' 5.5 (1.664 m) Weight: 178 lb (80.7 kg) BMI (Calculated): 29.16 Weight at Last Visit: 181lb Weight Lost Since Last Visit: 3lb Weight Gained Since Last Visit: 0 Starting Weight: 202lb Total Weight Loss (lbs): 23 lb (10.4 kg) Peak Weight: 212lb   Body Composition  Body Fat %: 33.9 % Fat Mass (lbs): 60.4 lbs Muscle Mass (lbs): 112 lbs Total Body Water (lbs): 76.2 lbs Visceral Fat Rating : 4   Other Clinical Data Fasting: yes Labs: no Today's Visit #: 5 Starting Date: 06/18/23    OBESITY Cynthis is here to discuss her progress with her obesity treatment plan along with follow-up of her obesity related diagnoses.    Nutrition Plan: the Category 2 plan + 100- 0% adherence.  Current exercise: none  Interim History:  She is down an additional 3 lbs since her last visit.  She states that she continues to have nausea even though we decreased her Wegovy  from 5.0 mg down to 2.5 mg.  She has had nausea but no current vomiting.  She had gone to the ER in the past for nausea and vomiting and had started Phenergan but states that it makes her feel worse.  She states that she has been using birth control, specifically a birth control pill on a regular basis.  She states that her last menstrual period was September 08, 2023 and appeared to be normal.  Eating all of the food on the plan., Protein intake is as prescribed, Water intake is adequate., and Denies polyphagia   Pharmacotherapy: Jannessa is on Mounjaro 2.5 mg which she is stopped temporarily.  She had been on Mounjaro 5.0 mg we had gone down to 2.5 mg at her last visit visit. Adverse side effects:  Nausea Hunger is well controlled.  Cravings are moderately controlled.  Assessment/Plan:   Nausea and vomiting:   She has had nausea and vomiting which could be associated with the GLP-1.  She was on a higher dose of GLP-1 but it was reduced, but she continues to have nausea.  She had an episode earlier in the month where she went to the emergency room for the nausea and vomiting.  She was given  Phenergan at that time that did seem to help with her nausea and vomiting.  She has also been on Zofran  has which has not been effective.  Plan:  HCG test: negative.  She will stop the Mounjaro 2.5 at this time, but we may resume in the future. She will take her current prescription of Zofran  if needed. She will follow the brat diet and then will progress to her plan.    Polyphagia  Deshawna endorses excessive hunger.  Medication(s): Wegovy  2.5 which she is not taking at this time.  Plan: Medication(s): Will stop Wegovy  2.5 at this time.  Will reassess in approximately 2 weeks. Will increase water, protein and fiber to help assuage hunger.  Will minimize foods that have a high glucose index/load to minimize reactive hypoglycemia.  Will follow her plan 85 to 95%.    Generalized Obesity: Current BMI BMI (Calculated): 29.16   Pharmacotherapy Plan Discontinue  Wegovy  0.25 mg SQ weekly  Nyjai is currently in the action stage of change. As such, her goal is to continue with weight loss efforts.  She has agreed to the Category 2 plan plus 100 calories.   Exercise goals: For substantial health benefits, adults should do at least 150 minutes (2 hours and 30 minutes) a week of moderate-intensity, or 75 minutes (1 hour and 15 minutes) a week of vigorous-intensity aerobic physical activity, or an equivalent combination of moderate- and vigorous-intensity aerobic activity. Aerobic activity should be performed in episodes of at least 10 minutes, and preferably, it should be spread throughout the  week.  Behavioral modification strategies: increasing lean protein intake, decreasing simple carbohydrates , no meal skipping, decrease eating out, meal planning , increase water intake, better snacking choices, keep healthy foods in the home, weigh protein portions, measure portion sizes, and mindful eating.  Emmagrace has agreed to follow-up with our clinic in 2 weeks.   No orders of the defined types were placed in this encounter.     Objective:   VITALS: Per patient if applicable, see vitals. GENERAL: Alert and in no acute distress. CARDIOPULMONARY: No increased WOB. Speaking in clear sentences.  PSYCH: Pleasant and cooperative. Speech normal rate and rhythm. Affect is appropriate. Insight and judgement are appropriate. Attention is focused, linear, and appropriate.  NEURO: Oriented as arrived to appointment on time with no prompting.   Attestation Statements:   This was prepared with the assistance of Engineer, civil (consulting).  Occasional wrong-word or sound-a-like substitutions may have occurred due to the inherent limitations of voice recognition   Clayborne Daring, DO

## 2023-09-19 ENCOUNTER — Encounter: Payer: Self-pay | Admitting: Family Medicine

## 2023-09-19 ENCOUNTER — Ambulatory Visit (INDEPENDENT_AMBULATORY_CARE_PROVIDER_SITE_OTHER): Admitting: Family Medicine

## 2023-09-19 VITALS — HR 95 | Resp 16 | Ht 65.0 in | Wt 181.0 lb

## 2023-09-19 DIAGNOSIS — T50905A Adverse effect of unspecified drugs, medicaments and biological substances, initial encounter: Secondary | ICD-10-CM

## 2023-09-19 DIAGNOSIS — R110 Nausea: Secondary | ICD-10-CM

## 2023-09-19 DIAGNOSIS — R109 Unspecified abdominal pain: Secondary | ICD-10-CM | POA: Diagnosis not present

## 2023-09-19 MED ORDER — ONDANSETRON 4 MG PO TBDP: 4.0000 mg | ORAL_TABLET | Freq: Three times a day (TID) | ORAL | 0 refills | Status: DC | PRN

## 2023-09-19 MED ORDER — FAMOTIDINE 20 MG PO TABS: 20.0000 mg | ORAL_TABLET | Freq: Two times a day (BID) | ORAL | 1 refills | Status: DC | PRN

## 2023-09-19 NOTE — Patient Instructions (Addendum)
 Stay off the wegovy  for another week Try the zofran  and pepcid  to help settle your stomach and try to eat small amounts so that your stomach isn't empty If you are still having same symptoms return early next week to do the labs I've ordered today, and I would stay off wegovy  until we have labs done.  Walk-in lab hours: Any weekday and office is open 8:00 -11:30 am or 1:30 -3:30 pm

## 2023-09-19 NOTE — Progress Notes (Signed)
 Patient ID: Anita Caldwell, female    DOB: November 04, 2000, 23 y.o.   MRN: [id number]  PCP: Leavy Mole, PA-C  Chief Complaint  Patient presents with   Abdominal Pain    X2 weeks. Instantly nauseous when eating even with changing diet. Stopped Wegovy  for 2 weeks. Diarrhea stopped over the weekend.    Subjective:   Anita Caldwell is a 23 y.o. female, presents to clinic with CC of the following:  HPI   abd pain started with wegovy  dose increase She has been losing weight, initially on wegovy  0.25 mg dose was doing well w/o sx, reports following a strict diet  She has held the wegovy  dose for 2 weeks and still has some sx, but last 3 d have noted some improvement Discussed the use of AI scribe software for clinical note transcription with the patient, who gave verbal consent to proceed.  History of Present Illness Anita Caldwell is a 23 year old female who presents with abdominal pain and nausea following an increase in her medication dose.  Abdominal pain - Onset following increase in medication dose to the second lowest level; previously asymptomatic on lowest dose - Pain described as tight, rumbling sensation - Primarily located in the periumbilical region, occasionally pain to right upper quadrant - Pain exacerbated by eating - Pain severity has decreased over the past couple of days - Pain recurred after physical activity at the pool on Sunday  Nausea and vomiting - Nausea present since medication dose increase - Vomiting episode occurred last Friday night - No current nausea today  Dietary changes and food avoidance - Adhering to a strict diet and has lost weight - Avoiding food due to fear of symptom exacerbation, especially with an upcoming vacation - Opting for simple, easily digestible foods to minimize symptoms - Has not eaten today due to concern for worsening symptoms  Associated symptoms and functional impact - Low energy on Saturday following vomiting episode -  Improvement in symptoms on Monday and Tuesday     Patient Active Problem List   Diagnosis Date Noted   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 May repeat in 2 hours if headache persists or recurs., Disp: , Rfl:    Vitamin D , Ergocalciferol , (DRISDOL ) 1.25 MG (50000 UNIT) CAPS capsule, Take 1 capsule (50,000 Units total) by mouth every 7 (seven) days., Disp: 5 capsule, Rfl: 0   Semaglutide -Weight Management (WEGOVY ) 0.25 MG/0.5ML SOAJ, Inject 0.25 mg into the skin once a week. (Patient not taking: Reported on 09/19/2023), Disp: 2 mL, Rfl: 0   No Known Allergies   Social History   Tobacco Use   Smoking status: Never    Passive exposure: Never   Smokeless tobacco: Never  Vaping Use   Vaping status: Never Used  Substance Use Topics   Alcohol use: Never   Drug use: Never      Chart Review Today: I personally reviewed active  problem list, medication list, allergies, family history, social history, health maintenance, notes from last encounter, lab results, imaging with the patient/caregiver today.   Review of Systems  Constitutional: Negative.   HENT: Negative.    Eyes: Negative.   Respiratory: Negative.    Cardiovascular: Negative.   Gastrointestinal: Negative.   Endocrine: Negative.   Genitourinary: Negative.   Musculoskeletal: Negative.   Skin: Negative.   Allergic/Immunologic: Negative.   Neurological:  Negative.   Hematological: Negative.   Psychiatric/Behavioral: Negative.    All other systems reviewed and are negative.      Objective:   Vitals:   09/19/23 1359  Pulse: 95  Resp: 16  SpO2: 96%  Weight: 181 lb (82.1 kg)  Height: 5' 5 (1.651 m)    Body mass index is 30.12 kg/m.  Physical Exam Vitals and nursing note reviewed.  Constitutional:      General: She is not in acute distress.    Appearance: Normal appearance. She is well-developed. She is not ill-appearing, toxic-appearing or diaphoretic.  HENT:     Head: Normocephalic and atraumatic.     Right Ear: External ear normal.     Left Ear: External ear normal.     Nose: Nose normal.  Eyes:     General: No scleral icterus.       Right eye: No discharge.        Left eye: No discharge.     Conjunctiva/sclera: Conjunctivae normal.  Neck:     Trachea: No tracheal deviation.  Cardiovascular:     Rate and Rhythm: Normal rate.  Pulmonary:     Effort: Pulmonary effort is normal. No respiratory distress.     Breath sounds: No stridor.  Abdominal:     General: Abdomen is flat. Bowel sounds are normal. There is no distension or abdominal bruit.     Palpations: Abdomen is soft. There is no hepatomegaly, splenomegaly or mass.     Tenderness: There is no abdominal tenderness. There is no right CVA tenderness, left CVA tenderness, guarding or rebound. Negative signs include Murphy's sign.     Hernia: No hernia is present.  Skin:    General: Skin is warm and dry.     Findings: No rash.  Neurological:     Mental Status: She is alert.     Motor: No abnormal muscle tone.     Coordination: Coordination normal.     Gait: Gait normal.  Psychiatric:        Mood and Affect: Mood normal.        Behavior: Behavior normal.      Results for orders placed or performed in visit on 09/18/23  POCT urine pregnancy   Collection Time: 09/18/23  8:00 AM  Result Value Ref Range   Preg Test, Ur Negative Negative       Assessment &  Plan:   Assessment & Plan 1. Abdominal pain, unspecified abdominal location (Primary) Abdominal pain primarily around the umbilicus and occasionally in the right upper quadrant, described as tight and bloated, worsening with eating. Symptoms began after an increased dose of a weight loss medication (wegovy  0.50 mg)  Slight improvement noted over the past few days after holding med for 2 weeks.  - continue to hold wegovy  for another week, monitor sx - Prescribe famotidine  for indigestion and bloating. - Prescribe ondansetron  for nausea prn - abd exam benign today and pt well appearing - Advise consumption of simple, easily digestible foods such as fruits and simple carbohydrates initially and  then can advance diet as tolerated - if not improvement in the next week complete labs and continue to hold wegovy . - Comprehensive metabolic panel with GFR; Future - CBC with Differential/Platelet; Future - Lipase; Future - Urine Culture; Future  2. Nausea Trial of zofran  prn, pepcid  for indigestion/n  3. Medication side effect, initial encounter Suspect GI sx from wegovy  increased dose - may need to stay on lowest dose longer or d/c med, option to try zepbound  as well    General Health Maintenance Engaged in weight loss and dietary changes with a dietitian. Acknowledged weight loss achieved. Cautioned about medication doses affecting gastrointestinal function. Discussed alternative medications like Zepbound , which may have fewer side effects compared to  Wegovy . - Continue dietary modifications as advised by dietitian/weight management - Consider restarting weight loss medication at a lower dose if symptoms resolve and she chooses to resume. - Consider alternative medications like Zepbound  if resuming weight loss treatment.  Recording duration: 11 minutes       Michelene Cower, PA-C 09/19/23 2:31 PM

## 2023-09-26 ENCOUNTER — Encounter: Payer: Self-pay | Admitting: Bariatrics

## 2023-09-26 ENCOUNTER — Telehealth (INDEPENDENT_AMBULATORY_CARE_PROVIDER_SITE_OTHER): Payer: Self-pay | Admitting: *Deleted

## 2023-09-26 ENCOUNTER — Other Ambulatory Visit: Payer: Self-pay | Admitting: Bariatrics

## 2023-09-26 MED ORDER — ZEPBOUND 2.5 MG/0.5ML ~~LOC~~ SOAJ
2.5000 mg | SUBCUTANEOUS | 0 refills | Status: DC
Start: 2023-09-26 — End: 2023-10-10

## 2023-09-26 NOTE — Telephone Encounter (Signed)
 PA SUBMITTED VIA [employer]S  Anita Caldwell (Key: [id number])  Your information has been sent to [employer] [place]  [employer].

## 2023-09-27 NOTE — Telephone Encounter (Signed)
 Message from Plan PA Case: [id number],  Status: Approved, Coverage Starts on: 09/26/2023 12:00:00 AM,  Coverage Ends on: 03/24/2024 12:00:00 AM..  Authorization Expiration Date: March 24, 2024.   Patient notified via Mychart message.

## 2023-09-27 NOTE — Telephone Encounter (Signed)
**Note De-identified  [place] Obfuscation** Please advise 

## 2023-10-01 ENCOUNTER — Ambulatory Visit: Admitting: Bariatrics

## 2023-10-02 ENCOUNTER — Ambulatory Visit: Admitting: Bariatrics

## 2023-10-04 ENCOUNTER — Ambulatory Visit: Admitting: Bariatrics

## 2023-10-08 ENCOUNTER — Ambulatory Visit: Admitting: Bariatrics

## 2023-10-10 ENCOUNTER — Ambulatory Visit (INDEPENDENT_AMBULATORY_CARE_PROVIDER_SITE_OTHER): Admitting: Bariatrics

## 2023-10-10 ENCOUNTER — Encounter: Payer: Self-pay | Admitting: Bariatrics

## 2023-10-10 VITALS — BP 97/70 | HR 79 | Temp 97.7°F | Ht 65.5 in | Wt 178.0 lb

## 2023-10-10 DIAGNOSIS — E669 Obesity, unspecified: Secondary | ICD-10-CM | POA: Diagnosis not present

## 2023-10-10 DIAGNOSIS — E88819 Insulin resistance, unspecified: Secondary | ICD-10-CM

## 2023-10-10 DIAGNOSIS — Z6829 Body mass index (BMI) 29.0-29.9, adult: Secondary | ICD-10-CM

## 2023-10-10 DIAGNOSIS — R632 Polyphagia: Secondary | ICD-10-CM | POA: Diagnosis not present

## 2023-10-10 MED ORDER — WEGOVY 0.25 MG/0.5ML ~~LOC~~ SOAJ
0.2500 mg | SUBCUTANEOUS | 0 refills | Status: DC
Start: 2023-10-10 — End: 2023-10-31

## 2023-10-10 NOTE — Progress Notes (Signed)
 WEIGHT SUMMARY AND BIOMETRICS  Weight Lost Since Last Visit: 0  Weight Gained Since Last Visit: 0   Vitals Temp: 97.7 F (36.5 C) BP: 97/70 Pulse Rate: 79 SpO2: 99 %   Anthropometric Measurements Height: 5' 5.5 (1.664 m) Weight: 178 lb (80.7 kg) BMI (Calculated): 29.16 Weight at Last Visit: 178lb Weight Lost Since Last Visit: 0 Weight Gained Since Last Visit: 0 Starting Weight: 202lb Total Weight Loss (lbs): 23 lb (10.4 kg) Peak Weight: 212lb   Body Composition  Body Fat %: 29.1 % Fat Mass (lbs): 52 lbs Muscle Mass (lbs): 120.2 lbs Total Body Water (lbs): 77.6 lbs Visceral Fat Rating : 3   Other Clinical Data Fasting: no Labs: no Today's Visit #: 6 Starting Date: 06/18/23    OBESITY Janiel is here to discuss her progress with her obesity treatment plan along with follow-up of her obesity related diagnoses.    Nutrition Plan: the Category 2 plan + 100 - 50% adherence.  Current exercise: running/ jogging and walking  Interim History:  Her weight remains the same.  She was prescribed the Zepbound  to see if it would be less nausea promoting but her insurance will not cover Zepbound .  She has been on Wegovy  had some nausea had moved up to a higher dose and was moved back down to the lower dose so that she can tolerate it. Eating all of the food on the plan., Protein intake is as prescribed, Is not skipping meals, and Water intake is adequate.   Pharmacotherapy: Diania is on Wegovy  0.25 mg SQ weekly Adverse side effects: None, occasional nausea Hunger is moderately controlled.  Cravings are moderately controlled.  Assessment/Plan:   Amie Cowens endorses excessive hunger.  Medication(s): Wegovy  Effects of medication:  moderately controlled. Cravings are moderately controlled.   Plan: Medication(s): Wegovy  0.25 mg SQ weekly Will  increase water, protein and fiber to help assuage hunger.  Will minimize foods that have a high glucose index/load to minimize reactive hypoglycemia.    Insulin  Resistance Elisabetta has had elevated fasting insulin  readings. Goal is HgbA1c < 5.7, fasting insulin  at l0 or less, and preferably at 5.  She reports less polyphagia. Medication(s): Wegovy  Lab Results  Component Value Date   HGBA1C 5.1 06/18/2023   Lab Results  Component Value Date   INSULIN  17.8 06/18/2023    Plan Medication(s): Wegovy  0.25 mg SQ weekly Will work on the agreed upon plan. Will minimize refined carbohydrates ( sweets and starches), and focus more on complex carbohydrates.  Increase the micronutrients found in leafy greens, which include magnesium, polyphenols, and vitamin C which have been postulated to help with insulin  sensitivity. Minimize fast food and cook more meals at home.  Increase fiber to 25 to 30 grams daily.  Recipes 1 and 2 given.  She will start to do meal planning on a regular basis.  Generalized Obesity: Current BMI BMI (Calculated): 29.16   Pharmacotherapy Plan Continue and refill  Wegovy  0.25 mg SQ weekly  Darlinda is currently in the action stage of change. As such, her goal is to continue with weight loss efforts.  She has agreed to the Category 2 plan + 100 calories.   Exercise goals: For substantial health benefits, adults should do at least 150 minutes (2 hours and 30 minutes) a week of moderate-intensity, or 75 minutes (1 hour and 15 minutes) a week of vigorous-intensity aerobic physical activity, or an equivalent combination of moderate- and vigorous-intensity aerobic activity. Aerobic activity should be performed in episodes of at least 10 minutes, and preferably, it should be spread throughout the week.  Behavioral modification strategies: increasing lean protein intake, no meal skipping, meal planning , increase water intake, better snacking choices, planning for success,  increasing vegetables, increasing fiber rich foods, decrease snacking , avoiding temptations, keep healthy foods in the home, weigh protein portions, measure portion sizes, and mindful eating.  Janne has agreed to follow-up with our clinic in 4 weeks.    Objective:   VITALS: Per patient if applicable, see vitals. GENERAL: Alert and in no acute distress. CARDIOPULMONARY: No increased WOB. Speaking in clear sentences.  PSYCH: Pleasant and cooperative. Speech normal rate and rhythm. Affect is appropriate. Insight and judgement are appropriate. Attention is focused, linear, and appropriate.  NEURO: Oriented as arrived to appointment on time with no prompting.   Attestation Statements:   This was prepared with the assistance of Engineer, civil (consulting).  Occasional wrong-word or sound-a-like substitutions may have occurred due to the inherent limitations of voice recognition   Clayborne Daring, DO

## 2023-10-18 ENCOUNTER — Ambulatory Visit: Admitting: General Practice

## 2023-10-31 ENCOUNTER — Encounter: Payer: Self-pay | Admitting: Bariatrics

## 2023-10-31 ENCOUNTER — Ambulatory Visit (INDEPENDENT_AMBULATORY_CARE_PROVIDER_SITE_OTHER): Admitting: Bariatrics

## 2023-10-31 DIAGNOSIS — E669 Obesity, unspecified: Secondary | ICD-10-CM | POA: Diagnosis not present

## 2023-10-31 DIAGNOSIS — Z6828 Body mass index (BMI) 28.0-28.9, adult: Secondary | ICD-10-CM | POA: Diagnosis not present

## 2023-10-31 DIAGNOSIS — E559 Vitamin D deficiency, unspecified: Secondary | ICD-10-CM | POA: Diagnosis not present

## 2023-10-31 DIAGNOSIS — E66811 Obesity, class 1: Secondary | ICD-10-CM

## 2023-10-31 MED ORDER — VITAMIN D (ERGOCALCIFEROL) 1.25 MG (50000 UNIT) PO CAPS
50000.0000 [IU] | ORAL_CAPSULE | ORAL | 0 refills | Status: DC
Start: 2023-10-31 — End: 2023-12-12

## 2023-10-31 MED ORDER — WEGOVY 0.25 MG/0.5ML ~~LOC~~ SOAJ: 0.2500 mg | SUBCUTANEOUS | 0 refills | Status: DC

## 2023-10-31 NOTE — Progress Notes (Signed)
 WEIGHT SUMMARY AND BIOMETRICS  Weight Lost Since Last Visit: 5lb  Weight Gained Since Last Visit: 0   Vitals Temp: (!) 97.4 F (36.3 C) BP: 124/82 Pulse Rate: 79 SpO2: 100 %   Anthropometric Measurements Height: 5' 5 (1.651 m) Weight: 173 lb (78.5 kg) BMI (Calculated): 28.79 Weight at Last Visit: 178lb Weight Lost Since Last Visit: 5lb Weight Gained Since Last Visit: 0 Starting Weight: 202lb Total Weight Loss (lbs): 28 lb (12.7 kg) Peak Weight: 212lb   Body Composition  Body Fat %: 30.6 % Fat Mass (lbs): 53.2 lbs Muscle Mass (lbs): 114.6 lbs Total Body Water (lbs): 76.4 lbs Visceral Fat Rating : 3   Other Clinical Data Fasting: no Labs: no Today's Visit #: 7 Starting Date: 06/18/23    OBESITY Madilynn is here to discuss her progress with her obesity treatment plan along with follow-up of her obesity related diagnoses.    Nutrition Plan: the Category 2 plan + 100 - 100% adherence.  Current exercise: none  Interim History:  She is down another 5 pounds since her last visit Eating all of the food on the plan., Protein intake is as prescribed, Is not skipping meals, Not journaling consistently., Water intake is adequate., and Denies excessive cravings.   Pharmacotherapy: Anny is on Wegovy  0.25 mg SQ weekly Adverse side effects: Nausea initially, occasional nausea on the first day.  Hunger is moderately controlled.  Cravings are moderately controlled.  Assessment/Plan:    Vitamin D  Deficiency Vitamin D  is not at goal of 50.  Most recent vitamin D  level was 27.7. She is on  prescription ergocalciferol  50,000 IU weekly. Lab Results  Component Value Date   VD25OH 27.7 (L) 06/18/2023   VD25OH 21.5 (L) 04/11/2021    Plan: Refill prescription vitamin D  50,000 IU weekly.   Polyphagia Stepheny endorses excessive hunger.  Medication(s):  Wegovy  0.25 mg weekly.  Effects of medication Appetite:  moderately controlled. Cravings are moderately controlled.   Plan: Medication(s): Wegovy  0.25 mg SQ weekly Will increase water, protein and fiber to help assuage hunger.  Will minimize foods that have a high glucose index/load to minimize reactive hypoglycemia.     Generalized Obesity: Current BMI BMI (Calculated): 28.79   Pharmacotherapy Plan Continue and refill  Wegovy  0.25 mg SQ weekly  Lyne is currently in the action stage of change. As such, her goal is to continue with weight loss efforts.  She has agreed to the Category 2 plan + 100 calories   Exercise goals: For substantial health benefits, adults should do at least 150 minutes (2 hours and 30 minutes) a week of moderate-intensity, or 75 minutes (1 hour and 15 minutes) a week of vigorous-intensity aerobic physical activity, or an equivalent combination of moderate- and vigorous-intensity aerobic activity. Aerobic activity should be performed in episodes of at least 10 minutes, and preferably, it should be spread throughout  the week.  Behavioral modification strategies: increasing lean protein intake, no meal skipping, decrease eating out, meal planning , better snacking choices, planning for success, increasing vegetables, increasing fiber rich foods, keep healthy foods in the home, increase frequency of journaling, weigh protein portions, measure portion sizes, work on smaller portions, and mindful eating.  Kijana has agreed to follow-up with our clinic in 4 weeks.   Objective:   VITALS: Per patient if applicable, see vitals. GENERAL: Alert and in no acute distress. CARDIOPULMONARY: No increased WOB. Speaking in clear sentences.  PSYCH: Pleasant and cooperative. Speech normal rate and rhythm. Affect is appropriate. Insight and judgement are appropriate. Attention is focused, linear, and appropriate.  NEURO: Oriented as arrived to appointment on time with no prompting.    Attestation Statements:   This was prepared with the assistance of Engineer, civil (consulting).  Occasional wrong-word or sound-a-like substitutions may have occurred due to the inherent limitations of voice recognition    Clayborne Daring, DO

## 2023-11-06 ENCOUNTER — Ambulatory Visit

## 2023-11-06 VITALS — BP 122/81 | HR 78 | Ht 65.0 in | Wt 179.0 lb

## 2023-11-06 DIAGNOSIS — N926 Irregular menstruation, unspecified: Secondary | ICD-10-CM

## 2023-11-06 DIAGNOSIS — Z3201 Encounter for pregnancy test, result positive: Secondary | ICD-10-CM

## 2023-11-06 LAB — POCT URINE PREGNANCY: Preg Test, Ur: POSITIVE — AB

## 2023-11-06 NOTE — Progress Notes (Signed)
 Anita Caldwell here for a UPT. Pt had a positive upt at home. LMP is 10/01/2023.     UPT in office Positive.    Reviewed medications and informed to start a PNV, if not already. Pt to follow up in 6+ weeks for New OB visit. Patient given safe over the counter list for during pregnancy. RN reviewed when to notify provider and or go to [facility].   Silvano LELON Piano, RN

## 2023-11-08 ENCOUNTER — Telehealth: Payer: Self-pay

## 2023-11-08 NOTE — Telephone Encounter (Signed)
 Spoke to patient and she confirmed her positive pregnancy. Notified her per Dr. Delores to stop Wegovy  due to possible birth defects. Patient confirmed she has stopped taking the medication. She stopped one week prior to getting the positive pregnancy test.

## 2023-11-14 ENCOUNTER — Ambulatory Visit: Admitting: General Practice

## 2023-11-14 ENCOUNTER — Other Ambulatory Visit

## 2023-11-28 ENCOUNTER — Other Ambulatory Visit: Payer: Self-pay

## 2023-11-28 ENCOUNTER — Other Ambulatory Visit

## 2023-11-28 ENCOUNTER — Ambulatory Visit: Admitting: Bariatrics

## 2023-11-28 DIAGNOSIS — Z3491 Encounter for supervision of normal pregnancy, unspecified, first trimester: Secondary | ICD-10-CM

## 2023-11-28 DIAGNOSIS — Z3201 Encounter for pregnancy test, result positive: Secondary | ICD-10-CM

## 2023-12-05 ENCOUNTER — Ambulatory Visit: Admitting: Bariatrics

## 2023-12-11 ENCOUNTER — Ambulatory Visit

## 2023-12-12 ENCOUNTER — Ambulatory Visit (INDEPENDENT_AMBULATORY_CARE_PROVIDER_SITE_OTHER): Admitting: Obstetrics & Gynecology

## 2023-12-12 ENCOUNTER — Other Ambulatory Visit (HOSPITAL_COMMUNITY)
Admission: RE | Admit: 2023-12-12 | Discharge: 2023-12-12 | Disposition: A | Discharge status: Home / Self Care | Source: Ambulatory Visit | Attending: Obstetrics & Gynecology | Admitting: Obstetrics & Gynecology

## 2023-12-12 ENCOUNTER — Encounter: Payer: Self-pay | Admitting: Obstetrics & Gynecology

## 2023-12-12 VITALS — BP 129/79 | HR 81 | Wt 188.0 lb

## 2023-12-12 DIAGNOSIS — Z3A10 10 weeks gestation of pregnancy: Secondary | ICD-10-CM | POA: Diagnosis not present

## 2023-12-12 DIAGNOSIS — E6609 Other obesity due to excess calories: Secondary | ICD-10-CM

## 2023-12-12 DIAGNOSIS — Z3401 Encounter for supervision of normal first pregnancy, first trimester: Secondary | ICD-10-CM | POA: Insufficient documentation

## 2023-12-12 DIAGNOSIS — E66811 Obesity, class 1: Secondary | ICD-10-CM | POA: Diagnosis not present

## 2023-12-12 DIAGNOSIS — Z6830 Body mass index (BMI) 30.0-30.9, adult: Secondary | ICD-10-CM

## 2023-12-12 DIAGNOSIS — O99210 Obesity complicating pregnancy, unspecified trimester: Secondary | ICD-10-CM | POA: Insufficient documentation

## 2023-12-12 DIAGNOSIS — E559 Vitamin D deficiency, unspecified: Secondary | ICD-10-CM

## 2023-12-12 DIAGNOSIS — O99211 Obesity complicating pregnancy, first trimester: Secondary | ICD-10-CM | POA: Diagnosis not present

## 2023-12-12 DIAGNOSIS — Z3400 Encounter for supervision of normal first pregnancy, unspecified trimester: Secondary | ICD-10-CM | POA: Insufficient documentation

## 2023-12-12 MED ORDER — ASPIRIN 81 MG PO TBEC: 81.0000 mg | DELAYED_RELEASE_TABLET | Freq: Every day | ORAL | 2 refills | Status: AC

## 2023-12-12 NOTE — Progress Notes (Signed)
 INITIAL PRENATAL VISIT  History:  Anita Caldwell is a 23 y.o. G2P0010 at [redacted]w[redacted]d by LMP, early ultrasound being seen today for her first obstetrical visit.  Her obstetrical history is significant for one early SAB. Patient does intend to breast feed. Pregnancy history fully reviewed.  Patient reports no complaints.  HISTORY: OB History  Gravida Para Term Preterm AB Living  2 0 0 0 1 0  SAB IAB Ectopic Multiple Live Births  1 0 0 0 0    # Outcome Date GA Lbr Len/2nd Weight Sex Type Anes PTL Lv  2 Current           1 SAB 05/2022            Last pap smear was done 04/19/2023 and was normal  Past Medical History:  Diagnosis Date   ADD (attention deficit disorder)    Anxiety    Depression    GERD (gastroesophageal reflux disease)    Migraine without aura and without status migrainosus, not intractable 11/07/2021   Vitamin D  deficiency    No past surgical history on file. Family History  Problem Relation Age of Onset   Depression Mother    High blood pressure Mother    Thyroid disease Mother    Depression Father    Diabetes Father    High blood pressure Father    Anxiety disorder Father    Drug abuse Father    Depression Maternal Grandmother    Social History   Tobacco Use   Smoking status: Never    Passive exposure: Never   Smokeless tobacco: Never  Vaping Use   Vaping status: Never Used  Substance Use Topics   Alcohol use: Never   Drug use: Never   No Known Allergies No current outpatient medications on file prior to visit.   No current facility-administered medications on file prior to visit.    Review of Systems Pertinent items noted in HPI and remainder of comprehensive ROS otherwise negative.  Indications for ASA therapy (per UpToDate) Two or more of the following: (Can do 81 mg daily) Nulliparity Yes Obesity (body mass index >30 kg/m2) Yes  Physical Exam:   Vitals:   12/12/23 1545  BP: 129/79  Pulse: 81  Weight: 188 lb (85.3 kg)   Fetal  Heart Rate (bpm): 155 (Unable to Doppler; this was done on bedside U/S by MD)     Bedside Ultrasound for FHR check: Viable intrauterine pregnancy with positive cardiac activity noted, fetal heart rate 155 bpm  General: well-developed, well-nourished female in no acute distress  Breasts:  deferred  Skin: normal coloration and turgor, no rashes  Neurologic: oriented, normal, negative, normal mood  Extremities: normal strength, tone, and muscle mass, ROM of all joints is normal  HEENT PERRLA, extraocular movement intact and sclera clear, anicteric  Neck supple and no masses  Cardiovascular: regular rate and rhythm  Respiratory:  no respiratory distress, normal breath sounds  Abdomen: soft, non-tender; bowel sounds normal; no masses,  no organomegaly  Pelvic: deferred   Assessment:  Pregnancy: G2P0010 Patient Active Problem List   Diagnosis Date Noted   Supervision of normal first pregnancy 12/12/2023   Obesity in pregnancy, antepartum 12/12/2023   Mild binge-eating disorder 01/02/2023   Panic attack 08/27/2022   Generalized anxiety disorder 08/27/2022   Migraine without aura and without status migrainosus, not intractable 11/07/2021   Attention deficit disorder (ADD) in adult 09/14/2021   Vitamin D  deficiency 04/18/2021   BMI 34.0-34.9,adult 03/13/2021  Plan:  1. Obesity in pregnancy, antepartum Recommended TWG 11-20 lbs.  Labs done today. ASA prescribed for PEC prophylaxis, - TSH Rfx on Abnormal to Free T4 - Comprehensive metabolic panel with GFR - aspirin EC 81 MG tablet; Take 1 tablet (81 mg total) by mouth at bedtime. Start taking when you are [redacted] weeks pregnant for rest of pregnancy for prevention of preeclampsia  Dispense: 300 tablet; Refill: 2 - Protein / creatinine ratio, urine  2. Vitamin D  deficiency - Vitamin D  (25 hydroxy) level checked, will follow up results and manage accordingly.  3. [redacted] weeks gestation of pregnancy 4. Encounter for supervision of normal first  pregnancy in first trimester (Primary) - CBC/D/Plt+RPR+Rh+ABO+RubIgG... - Culture, OB Urine - Cervicovaginal ancillary only - Hemoglobin A1c - aspirin EC 81 MG tablet; Take 1 tablet (81 mg total) by mouth at bedtime. Start taking when you are [redacted] weeks pregnant for rest of pregnancy for prevention of preeclampsia  Dispense: 300 tablet; Refill: 2 - US  MFM OB DETAIL +14 WK; Future - PANORAMA PRENATAL TEST - HORIZON CUSTOM - Enroll Patient in PreNatal Babyscripts  Initial labs drawn. Continue prenatal vitamins. Problem list reviewed and updated. Genetic Screening discussed, Panorama and Horizon: ordered. Ultrasound discussed; fetal anatomic survey: ordered. Anticipatory guidance about prenatal visits given including labs, ultrasounds, and testing. Weight gain recommendations per IOM guidelines reviewed: underweight/BMI 18.5 or less > 28 - 40 lbs; normal weight/BMI 18.5 - 24.9 > 25 - 35 lbs; overweight/BMI 25 - 29.9 > 15 - 25 lbs; obese/BMI 30 or more > 11 - 20 lbs. Discussed usage of the Babyscripts app for more information about pregnancy, and to track blood pressures. Patient was encouraged to use MyChart to review results, send requests, and have questions addressed.   The nature of Center for [facility] with multiple MDs and Advanced Practice Providers was explained to patient; also emphasized that residents, students are part of our team. Routine obstetric precautions reviewed. Encouraged to seek out care at our office or emergency room [facility] preferred) for urgent and/or emergent concerns. Return in about 4 weeks (around 01/09/2024) for OFFICE OB VISIT (MD or APP).     GLORIS HUGGER, MD, FACOG Obstetrician & Gynecologist, [facility] for [employer], [facility]

## 2023-12-13 ENCOUNTER — Ambulatory Visit: Payer: Self-pay | Admitting: Obstetrics & Gynecology

## 2023-12-13 ENCOUNTER — Encounter: Payer: Self-pay | Admitting: Obstetrics & Gynecology

## 2023-12-13 DIAGNOSIS — Z3401 Encounter for supervision of normal first pregnancy, first trimester: Secondary | ICD-10-CM

## 2023-12-13 LAB — CBC/D/PLT+RPR+RH+ABO+RUBIGG...
Antibody Screen: NEGATIVE
Basophils Absolute: 0 x10E3/uL (ref 0.0–0.2)
Basos: 0 %
EOS (ABSOLUTE): 0 x10E3/uL (ref 0.0–0.4)
Eos: 0 %
HCV Ab: NONREACTIVE
HIV Screen 4th Generation wRfx: NONREACTIVE
Hematocrit: 36.3 % (ref 34.0–46.6)
Hemoglobin: 12.1 g/dL (ref 11.1–15.9)
Hepatitis B Surface Ag: NEGATIVE
Immature Grans (Abs): 0 x10E3/uL (ref 0.0–0.1)
Immature Granulocytes: 0 %
Lymphocytes Absolute: 1.5 x10E3/uL (ref 0.7–3.1)
Lymphs: 18 %
MCH: 29.1 pg (ref 26.6–33.0)
MCHC: 33.3 g/dL (ref 31.5–35.7)
MCV: 87 fL (ref 79–97)
Monocytes Absolute: 0.5 x10E3/uL (ref 0.1–0.9)
Monocytes: 6 %
Neutrophils Absolute: 6.3 x10E3/uL (ref 1.4–7.0)
Neutrophils: 76 %
Platelets: 270 x10E3/uL (ref 150–450)
RBC: 4.16 x10E6/uL (ref 3.77–5.28)
RDW: 12.9 % (ref 11.7–15.4)
RPR Ser Ql: NONREACTIVE
Rh Factor: POSITIVE
Rubella Antibodies, IGG: 4.97 {index} (ref >0.99)
WBC: 8.3 x10E3/uL (ref 3.4–10.8)

## 2023-12-13 LAB — CERVICOVAGINAL ANCILLARY ONLY
Chlamydia: NEGATIVE
Comment: NEGATIVE
Comment: NEGATIVE
Comment: NORMAL
Neisseria Gonorrhea: NEGATIVE
Trichomonas: NEGATIVE

## 2023-12-13 LAB — HEMOGLOBIN A1C
Est. average glucose Bld gHb Est-mCnc: 97 mg/dL
Hgb A1c MFr Bld: 5 % (ref 4.8–5.6)

## 2023-12-13 LAB — COMPREHENSIVE METABOLIC PANEL WITH GFR
ALT: 13 IU/L (ref 0–32)
AST: 13 IU/L (ref 0–40)
Albumin: 4.5 g/dL (ref 4.0–5.0)
Alkaline Phosphatase: 57 IU/L (ref 41–116)
BUN/Creatinine Ratio: 21 (ref 9–23)
BUN: 12 mg/dL (ref 6–20)
Bilirubin Total: <0.2 mg/dL (ref 0.0–1.2)
CO2: 20 mmol/L (ref 20–29)
Calcium: 9.1 mg/dL (ref 8.7–10.2)
Chloride: 102 mmol/L (ref 96–106)
Creatinine, Ser: 0.58 mg/dL (ref 0.57–1.00)
Globulin, Total: 2.2 g/dL (ref 1.5–4.5)
Glucose: 85 mg/dL (ref 70–99)
Potassium: 3.8 mmol/L (ref 3.5–5.2)
Sodium: 136 mmol/L (ref 134–144)
Total Protein: 6.7 g/dL (ref 6.0–8.5)
eGFR: 132 mL/min/1.73 (ref >59)

## 2023-12-13 LAB — PROTEIN / CREATININE RATIO, URINE
Creatinine, Urine: 229.4 mg/dL
Protein, Ur: 12.8 mg/dL
Protein/Creat Ratio: 56 mg/g{creat} (ref 0–200)

## 2023-12-13 LAB — TSH RFX ON ABNORMAL TO FREE T4: TSH: 2.4 u[IU]/mL (ref 0.450–4.500)

## 2023-12-13 LAB — VITAMIN D 25 HYDROXY (VIT D DEFICIENCY, FRACTURES): Vit D, 25-Hydroxy: 23.3 ng/mL — ABNORMAL LOW (ref 30.0–100.0)

## 2023-12-13 LAB — HCV INTERPRETATION

## 2023-12-13 MED ORDER — VITAMIN D (ERGOCALCIFEROL) 1.25 MG (50000 UNIT) PO CAPS: 50000.0000 [IU] | ORAL_CAPSULE | ORAL | 2 refills | Status: AC

## 2023-12-13 NOTE — Addendum Note (Signed)
 Addended by: HERCHEL GRUMET A on: 12/13/2023 08:02 AM   Modules accepted: Orders

## 2023-12-14 LAB — CULTURE, OB URINE

## 2023-12-14 LAB — URINE CULTURE, OB REFLEX

## 2023-12-18 LAB — PANORAMA PRENATAL TEST FULL PANEL:PANORAMA TEST PLUS 5 ADDITIONAL MICRODELETIONS: FETAL FRACTION: 7.1

## 2023-12-25 LAB — HORIZON CUSTOM: REPORT SUMMARY: NEGATIVE

## 2024-01-01 IMAGING — US US PELVIS COMPLETE
2 series · 13 of 25 positions shown · non-contrast
Comparison: None.

CLINICAL DATA: Persistent abdominal pain, abdominal menstrual
cycle.

EXAM:
TRANSABDOMINAL ULTRASOUND OF PELVIS
TECHNIQUE: Transabdominal ultrasound examination of the pelvis was performed
including evaluation of the uterus, ovaries, adnexal regions, and
pelvic cul-de-sac.

[Series 1: us pelvis complete · 0.22mm/px · 12 of 42 slices shown (1 of 2)]
[im 1/42]
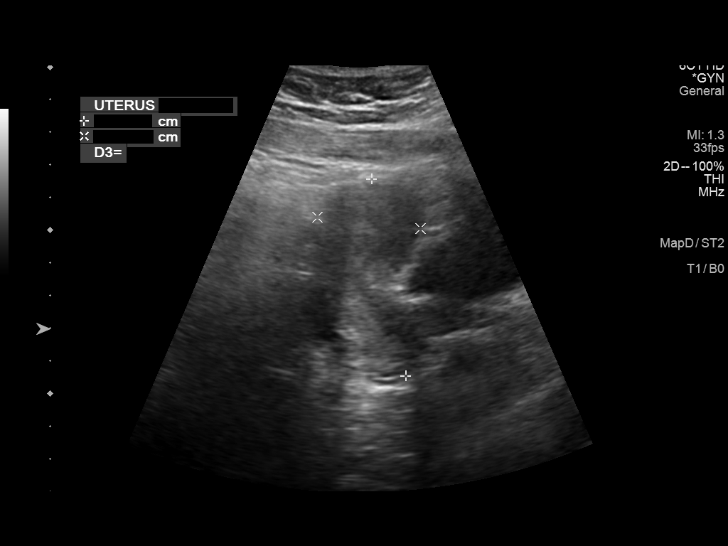
[im 4/42]
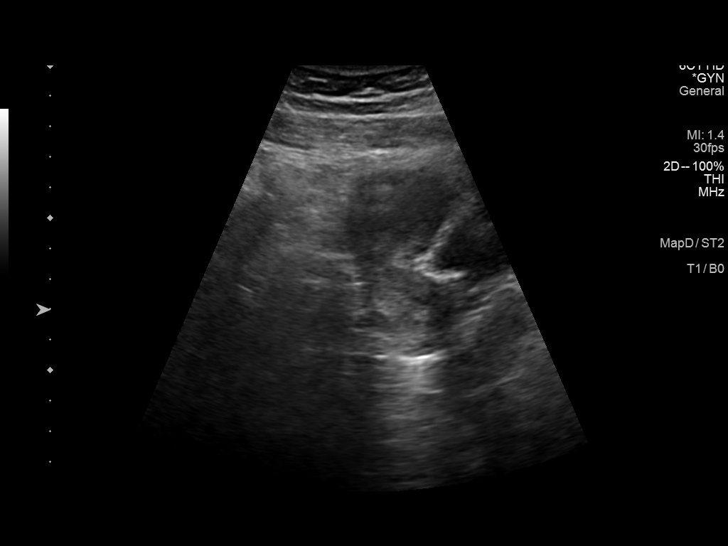
[im 8/42]
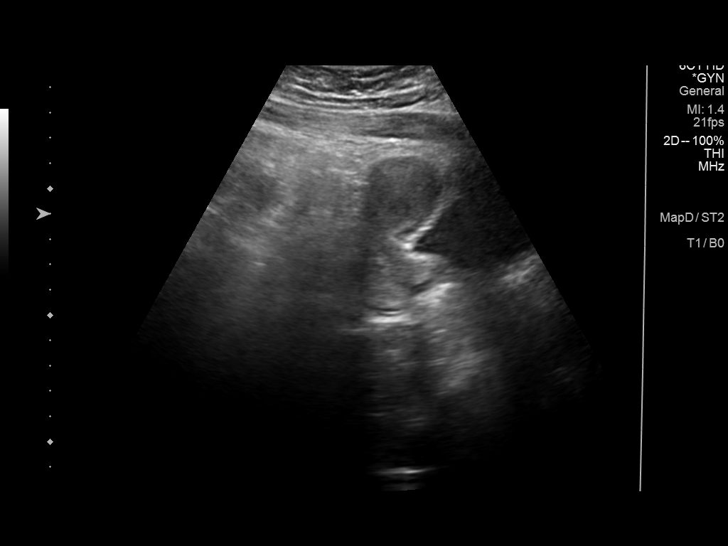
[im 12/42]
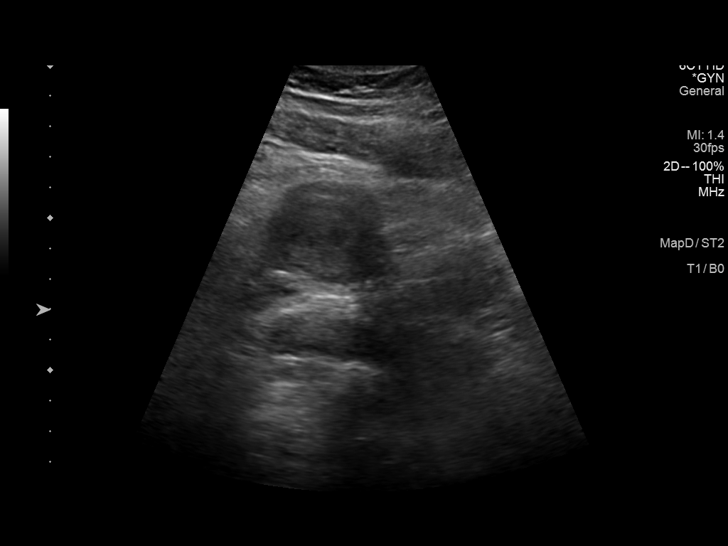
[im 15/42]
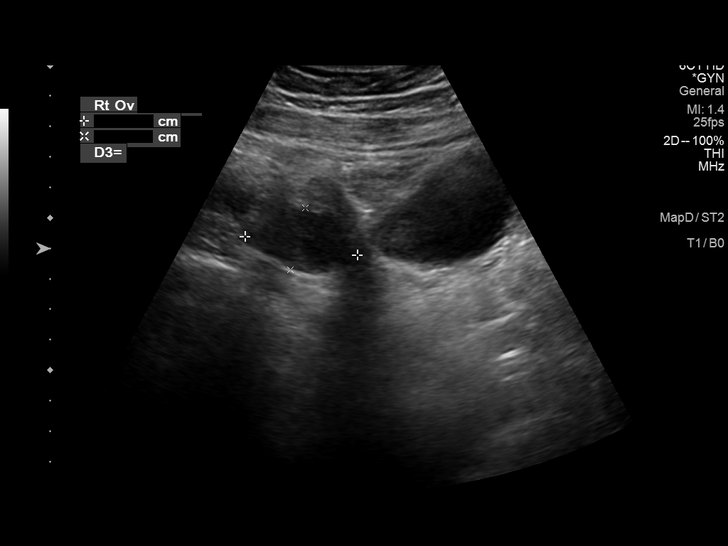
[im 19/42]
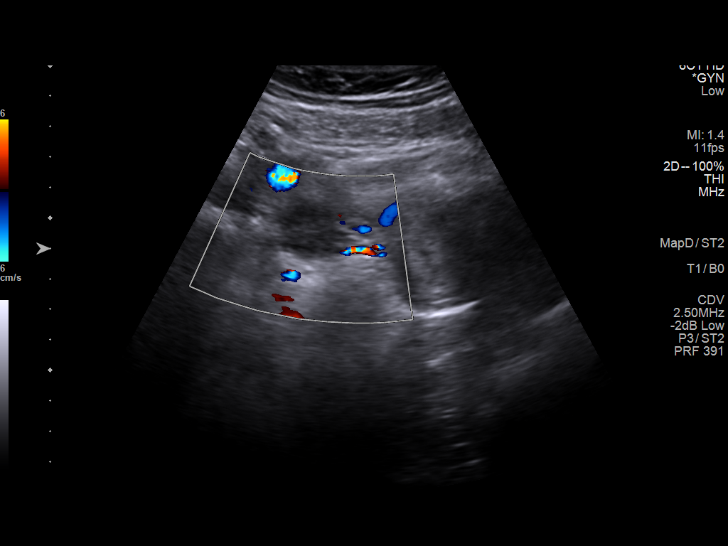
[im 23/42]
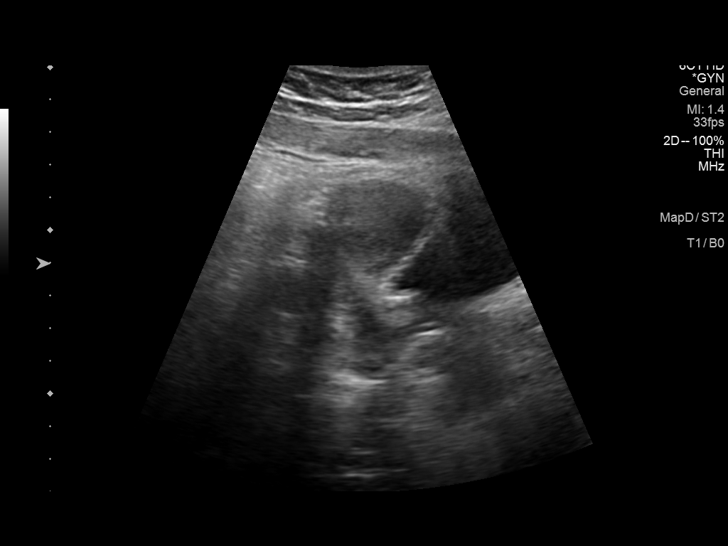
[im 27/42]
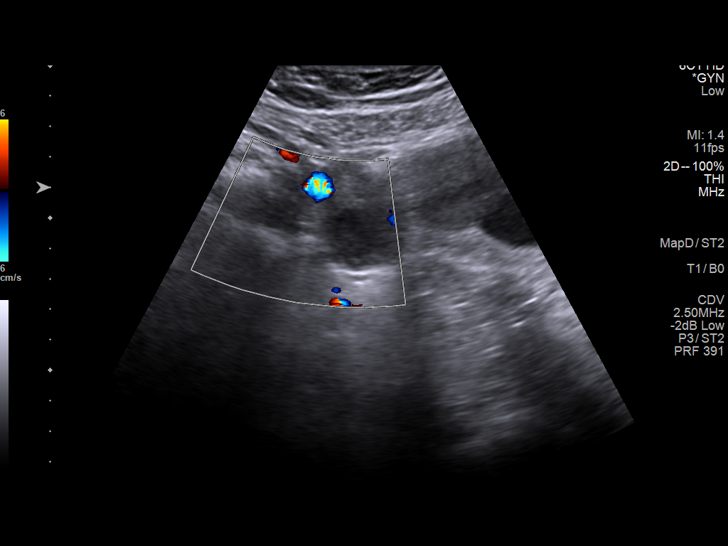
[im 30/42]
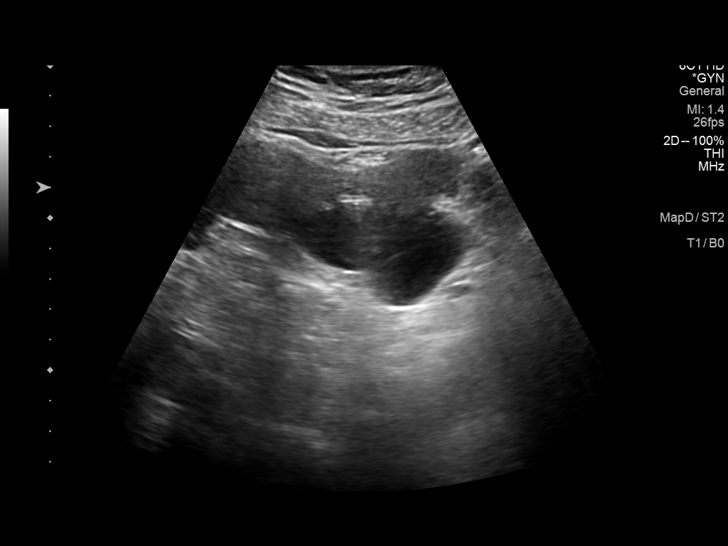
[im 34/42]
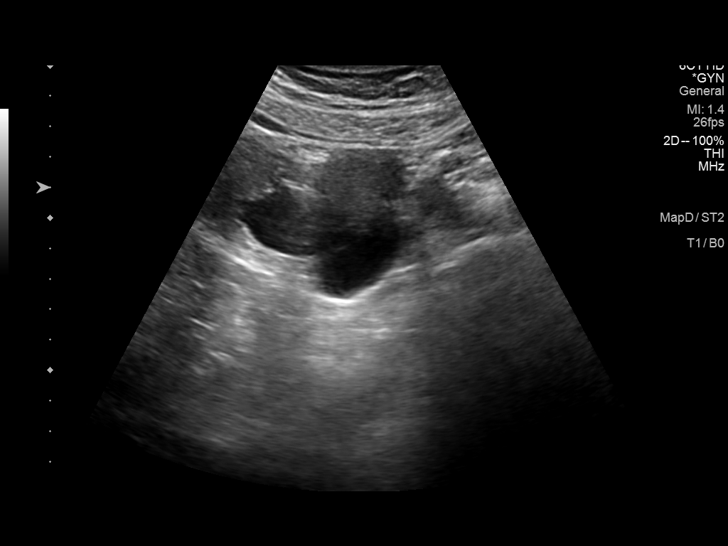
[im 38/42]
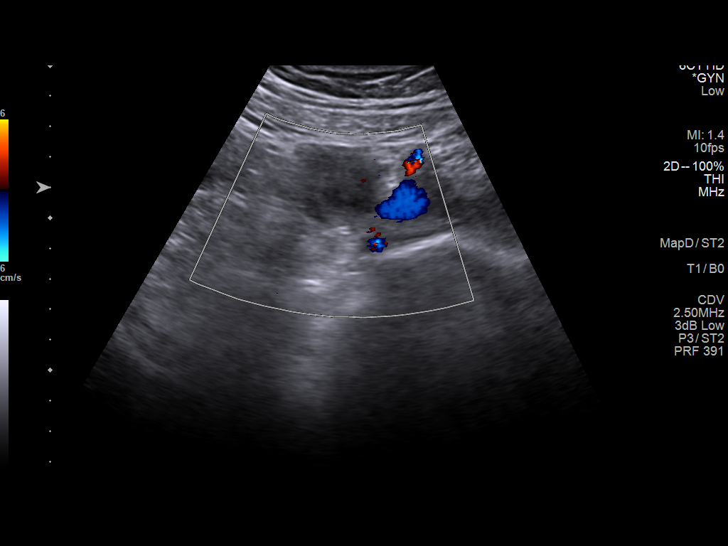
[im 42/42]
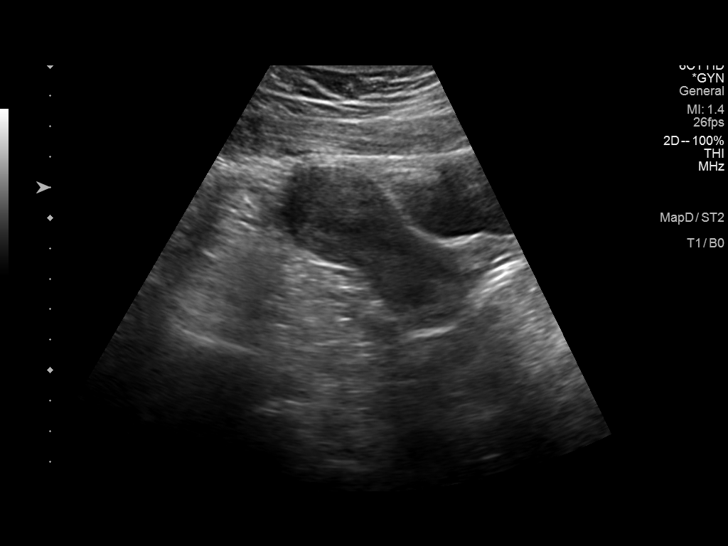

[Series 1001: us pelvis complete · 0.23mm/px · 1 of 3 slices shown (2 of 2)]
[im 3/3]
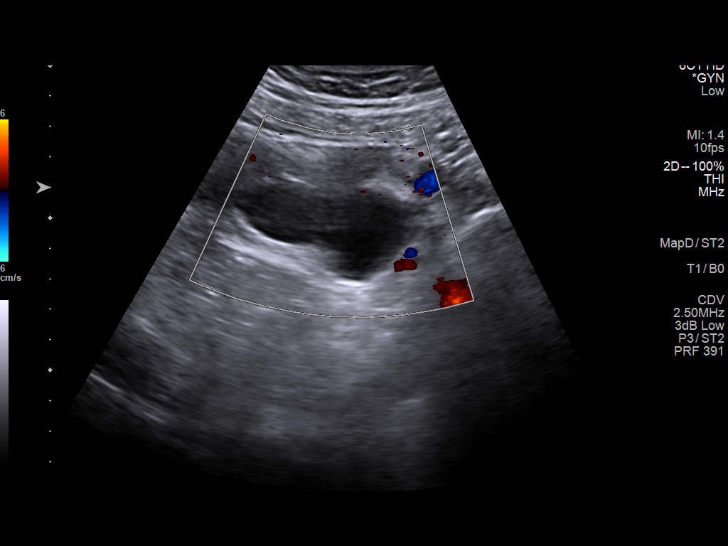

[13 of 25 positions shown; findings below may reference images not displayed]

FINDINGS: Uterus

Measurements: 6.1 x 3.2 x 4.2 = volume: 43 mL. No fibroids or other
mass visualized.

Endometrium

Thickness: 12 mm.  No focal abnormality visualized.

Right ovary

Measurements: 3.7 x 2.1 x 2.3 = volume: 9.3 mL. Normal appearance/no
adnexal mass.

Left ovary

Measurements: 5.3 x 3.0 x 5.6 = volume: 46.4 mL. There is an
avascular cystic structure in the left adnexal region, measuring
x 2.3 by 5.3 cm, which may represent a complex cyst or a
tubo-ovarian collection.

Other findings:  No abnormal free fluid.
IMPRESSION: 1. Normal examination of the uterus and right ovary.
2. Complex tubular structure in the left adnexal region, which may
be a cyst arising from the left ovary or a tubo-ovarian collection.
Follow-up examination with transvaginal ultrasound or
cross-sectional imaging including MR with contrast would be helpful.
Clinical correlation is suggested.

## 2024-01-03 ENCOUNTER — Ambulatory Visit: Admitting: Obstetrics and Gynecology

## 2024-01-03 ENCOUNTER — Telehealth: Payer: Self-pay

## 2024-01-03 ENCOUNTER — Encounter: Payer: Self-pay | Admitting: Obstetrics and Gynecology

## 2024-01-03 VITALS — BP 122/80 | HR 90 | Wt 191.1 lb

## 2024-01-03 DIAGNOSIS — F411 Generalized anxiety disorder: Secondary | ICD-10-CM | POA: Diagnosis not present

## 2024-01-03 DIAGNOSIS — Z3A13 13 weeks gestation of pregnancy: Secondary | ICD-10-CM

## 2024-01-03 DIAGNOSIS — R55 Syncope and collapse: Secondary | ICD-10-CM | POA: Diagnosis not present

## 2024-01-03 DIAGNOSIS — O99891 Other specified diseases and conditions complicating pregnancy: Secondary | ICD-10-CM

## 2024-01-03 DIAGNOSIS — Z6831 Body mass index (BMI) 31.0-31.9, adult: Secondary | ICD-10-CM | POA: Diagnosis not present

## 2024-01-03 DIAGNOSIS — Z3401 Encounter for supervision of normal first pregnancy, first trimester: Secondary | ICD-10-CM

## 2024-01-03 DIAGNOSIS — M549 Dorsalgia, unspecified: Secondary | ICD-10-CM

## 2024-01-03 LAB — POCT URINALYSIS DIPSTICK
Bilirubin, UA: NEGATIVE
Blood, UA: NEGATIVE
Glucose, UA: NEGATIVE
Ketones, UA: NEGATIVE
Leukocytes, UA: NEGATIVE
Nitrite, UA: NEGATIVE
Protein, UA: NEGATIVE
Spec Grav, UA: 1.01 (ref 1.010–1.025)
Urobilinogen, UA: 0.2 U/dL
pH, UA: 6 (ref 5.0–8.0)

## 2024-01-03 NOTE — Progress Notes (Addendum)
   PRENATAL VISIT NOTE  Subjective:  Anita Caldwell is a 23 y.o. G2P0010 at [redacted]w[redacted]d being seen today for ongoing prenatal care.  She is currently monitored for the following issues for this low-risk pregnancy and has BMI 34.0-34.9,adult; Vitamin D  deficiency; Attention deficit disorder (ADD) in adult; Migraine without aura and without status migrainosus, not intractable; Panic attack; Generalized anxiety disorder; Mild binge-eating disorder; Supervision of normal first pregnancy; and Obesity in pregnancy, antepartum on their problem list.  Patient reports 2 episodes of pre-syncope - sweating, feeling faint. She was on her feet at work when this happened. 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  Also notes back pain. Contractions: Not present. Vag. Bleeding: None.  Movement: Absent. Denies leaking of fluid.   The following portions of the patient's history were reviewed and updated as appropriate: allergies, current medications, past family history, past medical history, past social history, past surgical history and problem list.   Objective:   Vitals:   01/03/24 1104  BP: 122/80  Pulse: 90  Weight: 191 lb 1.9 oz (86.7 kg)   Fetal Status: Fetal Heart Rate (bpm): 167   Movement: Absent     General:  Alert, oriented and cooperative. Patient is in no acute distress.  Skin: Skin is warm and dry. No rash noted.   Cardiovascular: Normal heart rate noted  Respiratory: Normal respiratory effort, no problems with respiration noted  Abdomen: Soft, gravid, appropriate for gestational age.  Pain/Pressure: Present (back pain)      Assessment and Plan:  Pregnancy: G2P0010 at [redacted]w[redacted]d 1. Encounter for supervision of normal first pregnancy in first trimester (Primary) 2. [redacted] weeks gestation of pregnancy Discussed option for AFP next visit Recommended flu shot, considering for next visit Anatomy scheduled 12/10  3. Pre-syncope 6. Back pain Urine dip today without e/o UTI Reassurance provided and reviewed  red flag symptoms that would warrant further work up Encouraged hydration, adequate PO intake and rest  4. Generalized anxiety disorder No meds  5. BMI 31.0-31.9,adult ldASA Normal baseline labs . Please refer to After Visit Summary for other counseling recommendations.   Return in about 4 weeks (around 01/31/2024) for return OB at 17-18 weeks.  Future Appointments  Date Time Provider Department Center  01/24/2024  9:30 AM Cleatus Moccasin, MD [facility]  02/13/2024  2:00 PM [facility] PROVIDER 1 [facility]  02/13/2024  2:30 PM [facility] US2 [facility]  02/20/2024  3:30 PM Erik Kieth BROCKS, MD [facility]   Kieth BROCKS Erik, MD

## 2024-01-03 NOTE — Telephone Encounter (Signed)
 RN received message from front office to call patient to triage. Pt reported had episode last week with profusely sweating, felt like was going to pass out. 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 Pt reported having some harsh pain in her back, cramping comes and goes. Pt reported has not felt right the last 3 days.  Pt reported eating maple brown sugar oatmeal with fruit each morning. Pt reported had 2nd episode this week. RN scheduled for appointment today with provider.  Silvano LELON Piano, RN

## 2024-01-08 ENCOUNTER — Encounter: Payer: Self-pay | Admitting: Obstetrics and Gynecology

## 2024-01-09 ENCOUNTER — Encounter: Admitting: Obstetrics and Gynecology

## 2024-01-13 IMAGING — US US PELVIS COMPLETE WITH TRANSVAGINAL
2 series · 13 of 25 positions shown · non-contrast
Comparison: Ultrasound 06/10/2021

CLINICAL DATA: Abdomen pain

EXAM:
TRANSABDOMINAL AND TRANSVAGINAL ULTRASOUND OF PELVIS
TECHNIQUE: Both transabdominal and transvaginal ultrasound examinations of the
pelvis were performed. Transabdominal technique was performed for
global imaging of the pelvis including uterus, ovaries, adnexal
regions, and pelvic cul-de-sac. It was necessary to proceed with
endovaginal exam following the transabdominal exam to visualize the
uterus endometrium ovaries.

[Series 1: us pelvis complete with transvaginal · 0.30mm/px · 84 acquisitions, 12 frames shown (1 of 2)]
[im 1/84]
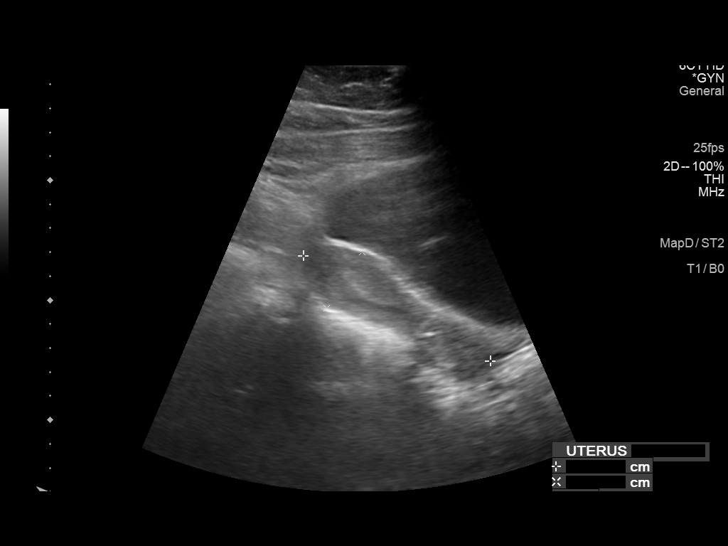
[im 8/84]
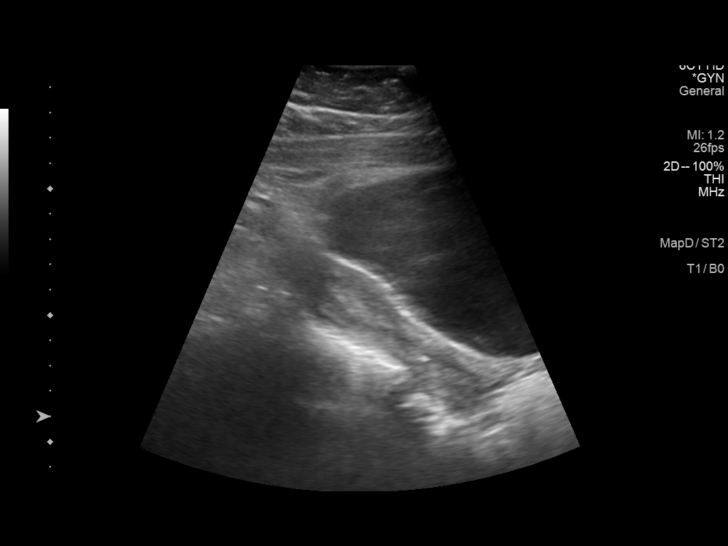
[im 15/84]
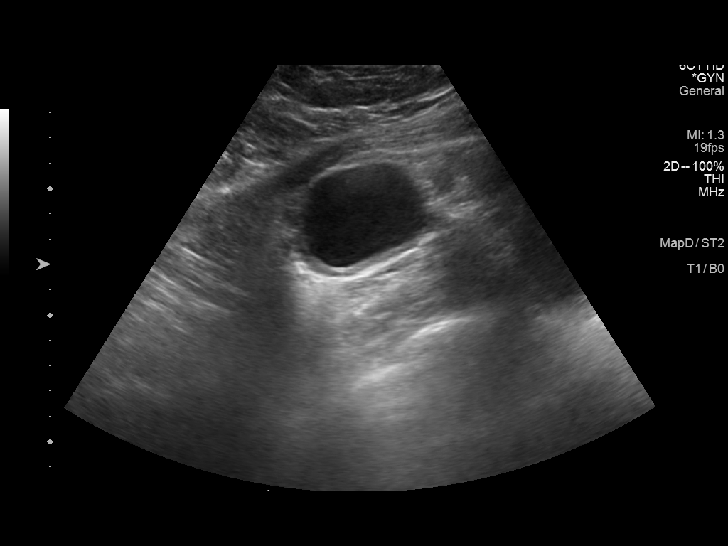
[im 22/84]
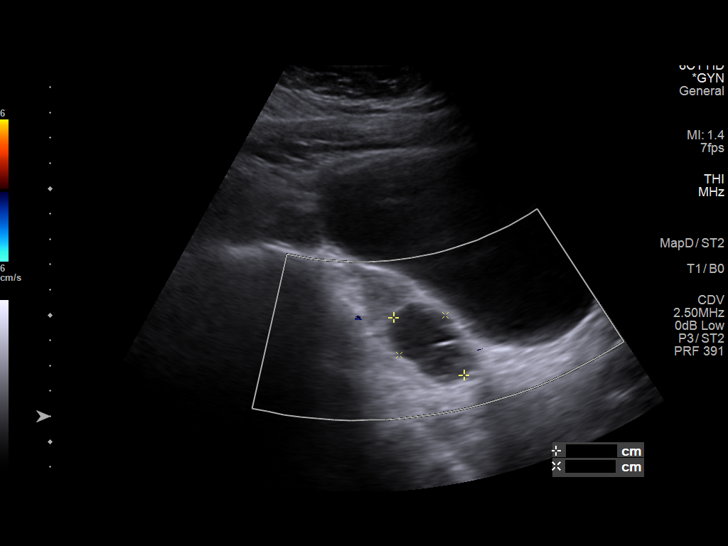
[im 29/84]
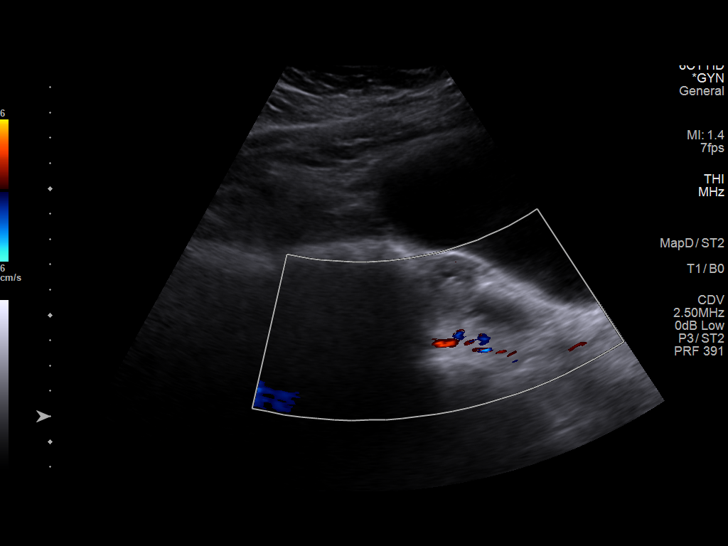
[im 37/84]
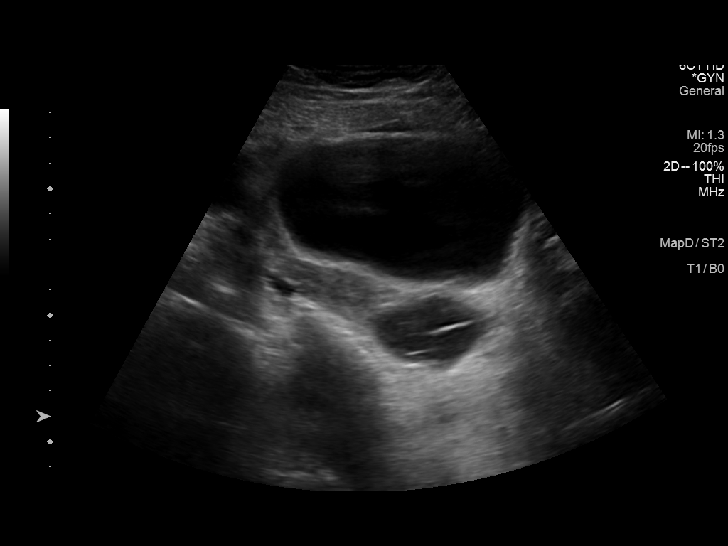
[im 44/84]
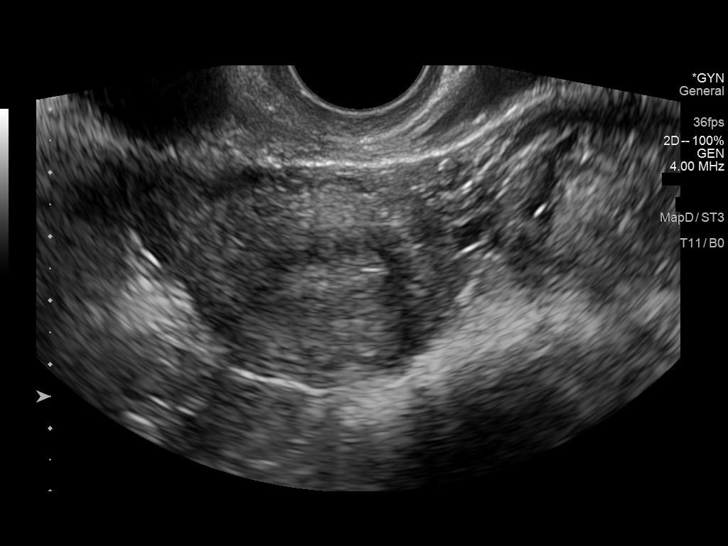
[im 51/84]
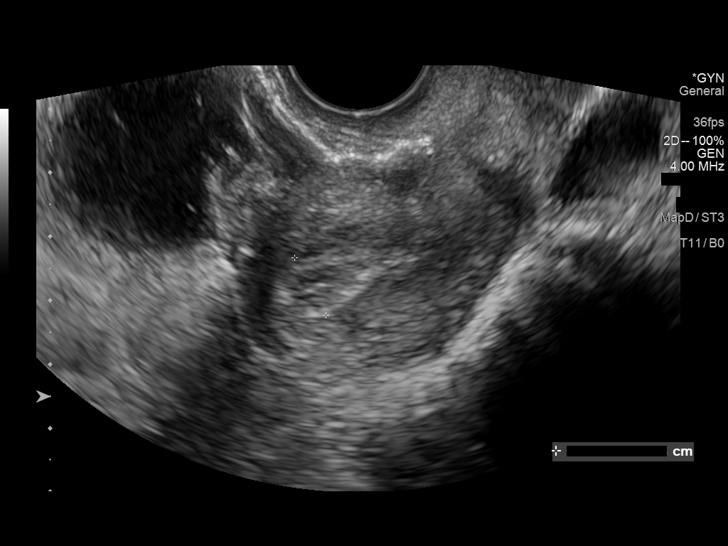
[im 58/84]
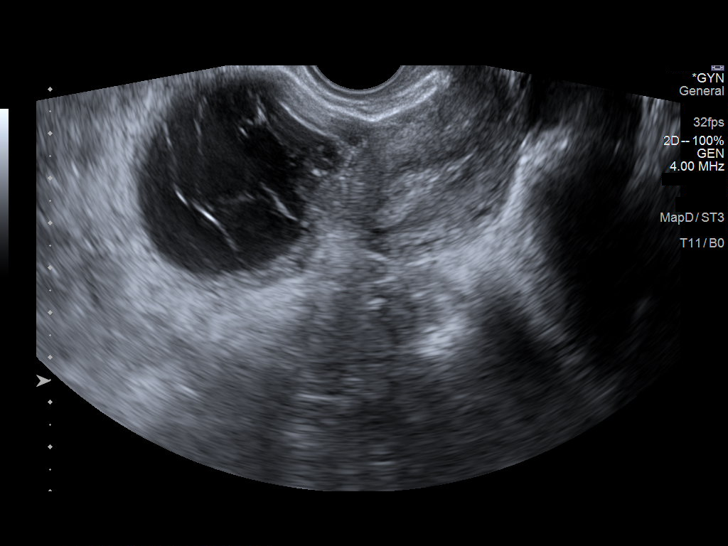
[im 65/84]
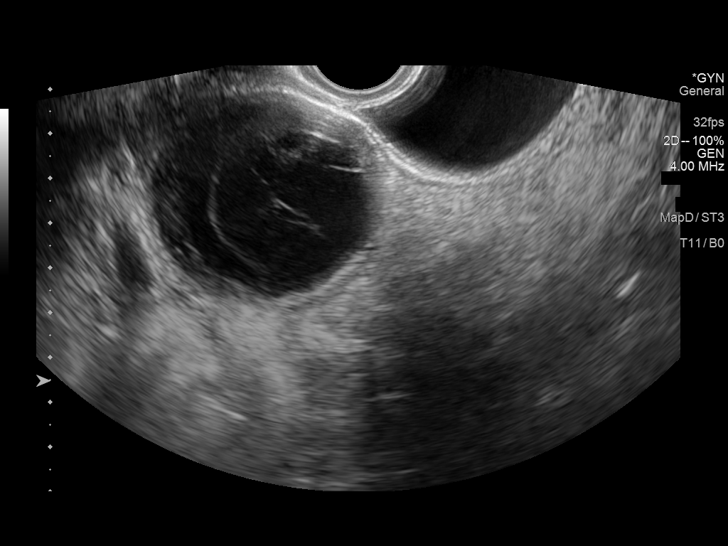
[im 73/84]
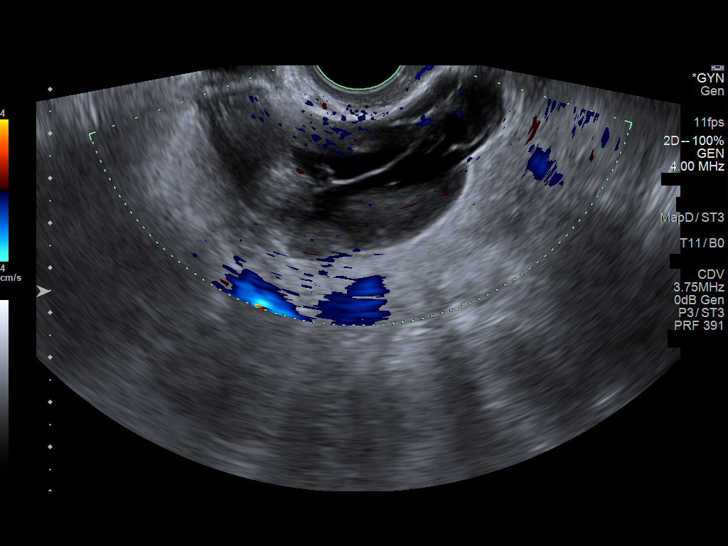
[im 80/84]
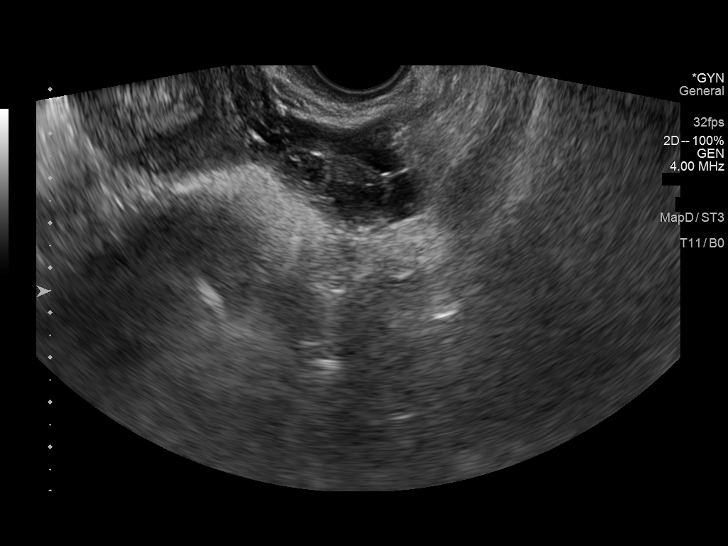

[Series 1001: us pelvis complete with transvaginal · 0.16mm/px · 1 of 4 slices shown (2 of 2)]
[im 1/4]
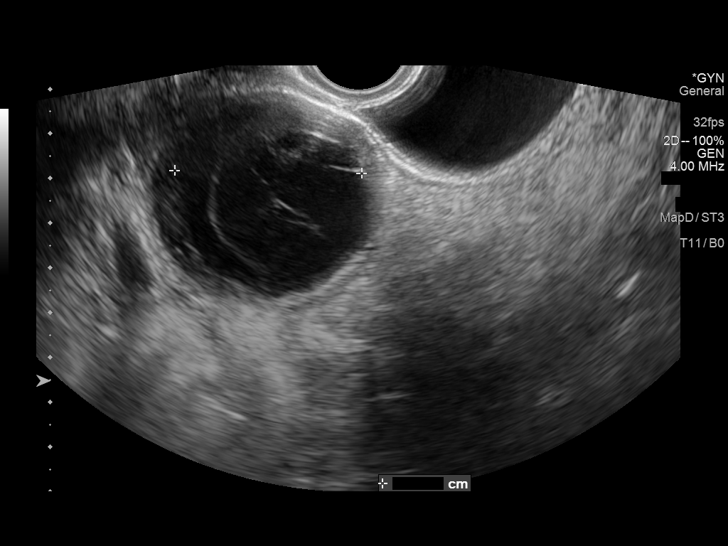

[13 of 25 positions shown; findings below may reference images not displayed]

FINDINGS: Uterus

Measurements: 9 x 2.8 x 4.8 cm = volume: 62.3 mL. No fibroids or
other mass visualized.

Endometrium

Thickness: 11.4 mm.  No focal abnormality visualized.

Right ovary

Measurements: 5.7 x 4.9 x 5.7 cm = volume: 82.4 mL. Complex cyst in
the right adnexa measuring 4.5 x 4.3 x 4.2 cm.

Left ovary

Measurements: 6.1 x 3 x 3.4 cm = volume: 31.6 mL. Complex cyst in
the left adnexa measuring 4.5 x 3.4 by 4.3 cm contains multiple thin
septations.

Other findings

Trace free fluid.
IMPRESSION: 1. 4.5 cm complex cyst in the right adnexa, possible hemorrhagic
cyst. Recommend 6-12 week sonographic follow-up.
2. 4.5 cm complex left adnexal cyst with multiple thin septations,
recommend surgical consultation.
3. Trace free fluid

## 2024-01-14 ENCOUNTER — Telehealth: Payer: Self-pay

## 2024-01-14 ENCOUNTER — Ambulatory Visit (INDEPENDENT_AMBULATORY_CARE_PROVIDER_SITE_OTHER)

## 2024-01-14 VITALS — BP 113/76 | HR 91 | Ht 65.0 in | Wt 192.1 lb

## 2024-01-14 DIAGNOSIS — O26892 Other specified pregnancy related conditions, second trimester: Secondary | ICD-10-CM | POA: Diagnosis not present

## 2024-01-14 DIAGNOSIS — R42 Dizziness and giddiness: Secondary | ICD-10-CM

## 2024-01-14 DIAGNOSIS — Z3A15 15 weeks gestation of pregnancy: Secondary | ICD-10-CM | POA: Diagnosis not present

## 2024-01-14 LAB — POCT URINALYSIS DIPSTICK
Bilirubin, UA: NEGATIVE
Blood, UA: NEGATIVE
Glucose, UA: NEGATIVE
Ketones, UA: NEGATIVE
Nitrite, UA: NEGATIVE
Protein, UA: NEGATIVE
Spec Grav, UA: 1.015 (ref 1.010–1.025)
Urobilinogen, UA: 0.2 U/dL
pH, UA: 5.5 (ref 5.0–8.0)

## 2024-01-14 LAB — GLUCOSE, POCT (MANUAL RESULT ENTRY): POC Glucose: 82 mg/dL (ref 70–99)

## 2024-01-14 MED ORDER — GLUCOSE BLOOD VI STRP: ORAL_STRIP | 0 refills | Status: DC

## 2024-01-14 MED ORDER — ACCU-CHEK SOFTCLIX LANCETS MISC: 1.0000 | 0 refills | Status: DC | PRN

## 2024-01-14 MED ORDER — ACCU-CHEK GUIDE W/DEVICE KIT: 1.0000 | PACK | 0 refills | Status: DC | PRN

## 2024-01-14 NOTE — Progress Notes (Signed)
 Pt is [redacted] weeks gestation, reported had an episode this morning about an hour ago, with sitting to standing position, feeling dizzy and lightheaded. Pt reported had protein waffles and low sugar protein yogurts. Pt reported feeling really weak, did drink juice and ate crackers. 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  Verbal orders received from Dr. Cris, CMP, CBG, Urine Dip and orthos. FHT=154. VS WNL, orthos WNL.  Urine Cx sent- (+) for Leukocytes Accu Check Supplies sent to pharmacy with orders to check blood sugar during episode, education given by RN. Patient to follow up in office and as scheduled for prenatal appointments.   Silvano LELON Piano, RN

## 2024-01-14 NOTE — Telephone Encounter (Signed)
 RN received message from front office to call patient to triage. Pt reported had an episode this morning about an hour ago, with sitting to standing position, feeling dizzy and lightheaded. Pt reported had protein waffles and low sugar protein yogurts. Pt reported feeling really weak, did drink juice and ate crackers. 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 Pt reported is now leaving work to go home, does have a hospital doctor. Reviewed with provider, patient scheduled for nurse visit with orders for CMP, orthos, urine dip and POCT CBG. Patient scheduled for 11am.  Silvano LELON Piano, RN

## 2024-01-16 LAB — URINE CULTURE, OB REFLEX

## 2024-01-16 LAB — CULTURE, OB URINE

## 2024-01-23 NOTE — Progress Notes (Unsigned)
 PRENATAL VISIT NOTE  Subjective:  Anita Caldwell is a 23 y.o. G2P0010 at [redacted]w[redacted]d being seen today for ongoing prenatal care.  She is currently monitored for the following issues for this low-risk pregnancy and has BMI 31.0-31.9,adult; Vitamin D  deficiency; Attention deficit disorder (ADD) in adult; Generalized anxiety disorder; and Supervision of normal first pregnancy on their problem list.  Patient reports LBP especially with standing at work.  Contractions: Not present. Vag. Bleeding: None.  Movement: Absent. Denies leaking of fluid.   The following portions of the patient's history were reviewed and updated as appropriate: allergies, current medications, past family history, past medical history, past social history, past surgical history and problem list.   Objective:   Vitals:   01/24/24 0937  BP: 100/66  Pulse: (!) 105  Weight: 193 lb (87.5 kg)    Fetal Status:  Fetal Heart Rate (bpm): 146   Movement: Absent    General: Alert, oriented and cooperative. Patient is in no acute distress.  Skin: Skin is warm and dry. No rash noted.   Cardiovascular: Normal heart rate noted  Respiratory: Normal respiratory effort, no problems with respiration noted  Abdomen: Soft, gravid, appropriate for gestational age.  Pain/Pressure: Present (lower back pain/comes and goes)     Pelvic: Cervical exam deferred        Extremities: Normal range of motion.  Edema: None  Mental Status: Normal mood and affect. Normal behavior. Normal judgment and thought content.      12/12/2023    4:35 PM 05/30/2023    3:09 PM 03/26/2023    4:01 PM  Depression screen PHQ 2/9  Decreased Interest 0 0 0  Down, Depressed, Hopeless 0 0 0  PHQ - 2 Score 0 0 0  Altered sleeping 0  0  Tired, decreased energy 0  0  Change in appetite 0  1  Feeling bad or failure about yourself  0  0  Trouble concentrating 0  0  Moving slowly or fidgety/restless 0  0  Suicidal thoughts 0  0  PHQ-9 Score 0   1      Data saved with a  previous flowsheet row definition        12/12/2023    4:35 PM 05/30/2023    3:09 PM 03/26/2023    4:01 PM 01/02/2023    3:25 PM  GAD 7 : Generalized Anxiety Score  Nervous, Anxious, on Edge 0 1 0 0  Control/stop worrying 0 1 0 1  Worry too much - different things 0 1 0 1  Trouble relaxing 0 1 0 0  Restless 0 0 0 0  Easily annoyed or irritable 0 0 0 0  Afraid - awful might happen 0 0 0 0  Total GAD 7 Score 0 4 0 2  Anxiety Difficulty    Not difficult at all    Assessment and Plan:  Pregnancy: G2P0010 at [redacted]w[redacted]d 1. Encounter for supervision of normal first pregnancy in first trimester (Primary) Recommend MSAFP Normal panorama and horizon.  Anatomy us  scheduled.  Taking ldASA.   2. Pregnancy with 16 completed weeks gestation  3. BMI 31.0-31.9,adult  Preterm labor symptoms and general obstetric precautions including but not limited to vaginal bleeding, contractions, leaking of fluid and fetal movement were reviewed in detail with the patient. Please refer to After Visit Summary for other counseling recommendations.   Return in about 4 weeks (around 02/21/2024) for OB VISIT, MD or APP.  Future Appointments  Date Time Provider Department Center  02/13/2024  2:00 PM [facility] PROVIDER 1 [facility]  02/13/2024  2:30 PM [facility] US2 [facility]  02/20/2024  3:30 PM Erik Kieth BROCKS, MD [facility]    Vina Solian, MD

## 2024-01-24 ENCOUNTER — Encounter: Payer: Self-pay | Admitting: Obstetrics and Gynecology

## 2024-01-24 ENCOUNTER — Ambulatory Visit: Admitting: Obstetrics and Gynecology

## 2024-01-24 VITALS — BP 100/66 | HR 105 | Wt 193.0 lb

## 2024-01-24 DIAGNOSIS — Z3A16 16 weeks gestation of pregnancy: Secondary | ICD-10-CM | POA: Diagnosis not present

## 2024-01-24 DIAGNOSIS — Z3401 Encounter for supervision of normal first pregnancy, first trimester: Secondary | ICD-10-CM | POA: Diagnosis not present

## 2024-01-24 DIAGNOSIS — Z6831 Body mass index (BMI) 31.0-31.9, adult: Secondary | ICD-10-CM | POA: Diagnosis not present

## 2024-01-24 NOTE — Patient Instructions (Signed)
 Fit Splint

## 2024-01-26 LAB — AFP, SERUM, OPEN SPINA BIFIDA
AFP MoM: 0.84
AFP Value: 23.4 ng/mL
Gest. Age on Collection Date: 16 wk
Maternal Age At EDD: 22.2 a
OSBR Risk 1 IN: 10000
Test Results:: NEGATIVE
Weight: 193 [lb_av]

## 2024-01-26 LAB — COMPREHENSIVE METABOLIC PANEL WITH GFR
ALT: 11 IU/L (ref 0–32)
AST: 15 IU/L (ref 0–40)
Albumin: 4.1 g/dL (ref 4.0–5.0)
Alkaline Phosphatase: 59 IU/L (ref 41–116)
BUN/Creatinine Ratio: 16 (ref 9–23)
BUN: 10 mg/dL (ref 6–20)
Bilirubin Total: <0.2 mg/dL (ref 0.0–1.2)
CO2: 21 mmol/L (ref 20–29)
Calcium: 9.2 mg/dL (ref 8.7–10.2)
Chloride: 104 mmol/L (ref 96–106)
Creatinine, Ser: 0.64 mg/dL (ref 0.57–1.00)
Globulin, Total: 2.1 g/dL (ref 1.5–4.5)
Glucose: 71 mg/dL (ref 70–99)
Potassium: 4.4 mmol/L (ref 3.5–5.2)
Sodium: 137 mmol/L (ref 134–144)
Total Protein: 6.2 g/dL (ref 6.0–8.5)
eGFR: 129 mL/min/1.73 (ref >59)

## 2024-01-28 ENCOUNTER — Ambulatory Visit: Payer: Self-pay | Admitting: Obstetrics and Gynecology

## 2024-01-28 ENCOUNTER — Encounter (HOSPITAL_COMMUNITY): Payer: Self-pay | Admitting: Obstetrics & Gynecology

## 2024-01-28 ENCOUNTER — Inpatient Hospital Stay (HOSPITAL_COMMUNITY)
Admission: AD | Admit: 2024-01-28 | Discharge: 2024-01-28 | Disposition: A | Discharge status: Home / Self Care | Attending: Obstetrics & Gynecology | Admitting: Obstetrics & Gynecology

## 2024-01-28 DIAGNOSIS — N949 Unspecified condition associated with female genital organs and menstrual cycle: Secondary | ICD-10-CM

## 2024-01-28 DIAGNOSIS — R102 Pelvic and perineal pain unspecified side: Secondary | ICD-10-CM | POA: Diagnosis not present

## 2024-01-28 DIAGNOSIS — O26892 Other specified pregnancy related conditions, second trimester: Secondary | ICD-10-CM | POA: Insufficient documentation

## 2024-01-28 DIAGNOSIS — Z3A17 17 weeks gestation of pregnancy: Secondary | ICD-10-CM | POA: Diagnosis not present

## 2024-01-28 DIAGNOSIS — R300 Dysuria: Secondary | ICD-10-CM | POA: Insufficient documentation

## 2024-01-28 DIAGNOSIS — Z3401 Encounter for supervision of normal first pregnancy, first trimester: Secondary | ICD-10-CM

## 2024-01-28 DIAGNOSIS — R109 Unspecified abdominal pain: Secondary | ICD-10-CM | POA: Diagnosis present

## 2024-01-28 LAB — COMPREHENSIVE METABOLIC PANEL WITH GFR
ALT: 12 U/L (ref 0–44)
AST: 13 U/L — ABNORMAL LOW (ref 15–41)
Albumin: 2.9 g/dL — ABNORMAL LOW (ref 3.5–5.0)
Alkaline Phosphatase: 43 U/L (ref 38–126)
Anion gap: 9 (ref 5–15)
BUN: 6 mg/dL (ref 6–20)
CO2: 20 mmol/L — ABNORMAL LOW (ref 22–32)
Calcium: 8.5 mg/dL — ABNORMAL LOW (ref 8.9–10.3)
Chloride: 104 mmol/L (ref 98–111)
Creatinine, Ser: 0.58 mg/dL (ref 0.44–1.00)
GFR, Estimated: >60 mL/min (ref >60)
Glucose, Bld: 78 mg/dL (ref 70–99)
Potassium: 4.1 mmol/L (ref 3.5–5.1)
Sodium: 133 mmol/L — ABNORMAL LOW (ref 135–145)
Total Bilirubin: 0.4 mg/dL (ref 0.0–1.2)
Total Protein: 5.8 g/dL — ABNORMAL LOW (ref 6.5–8.1)

## 2024-01-28 LAB — CBC WITH DIFFERENTIAL/PLATELET
Abs Immature Granulocytes: 0.04 K/uL (ref 0.00–0.07)
Basophils Absolute: 0 K/uL (ref 0.0–0.1)
Basophils Relative: 0 %
Eosinophils Absolute: 0 K/uL (ref 0.0–0.5)
Eosinophils Relative: 0 %
HCT: 34.4 % — ABNORMAL LOW (ref 36.0–46.0)
Hemoglobin: 11.8 g/dL — ABNORMAL LOW (ref 12.0–15.0)
Immature Granulocytes: 0 %
Lymphocytes Relative: 12 %
Lymphs Abs: 1.1 K/uL (ref 0.7–4.0)
MCH: 28.9 pg (ref 26.0–34.0)
MCHC: 34.3 g/dL (ref 30.0–36.0)
MCV: 84.1 fL (ref 80.0–100.0)
Monocytes Absolute: 0.5 K/uL (ref 0.1–1.0)
Monocytes Relative: 5 %
Neutro Abs: 7.7 K/uL (ref 1.7–7.7)
Neutrophils Relative %: 83 %
Platelets: 204 K/uL (ref 150–400)
RBC: 4.09 MIL/uL (ref 3.87–5.11)
RDW: 12.8 % (ref 11.5–15.5)
WBC: 9.3 K/uL (ref 4.0–10.5)
nRBC: 0 % (ref 0.0–0.2)

## 2024-01-28 LAB — WET PREP, GENITAL
Clue Cells Wet Prep HPF POC: NONE SEEN
Sperm: NONE SEEN
Trich, Wet Prep: NONE SEEN
WBC, Wet Prep HPF POC: <10 (ref <10)
Yeast Wet Prep HPF POC: NONE SEEN

## 2024-01-28 LAB — URINALYSIS, ROUTINE W REFLEX MICROSCOPIC
Bilirubin Urine: NEGATIVE
Glucose, UA: NEGATIVE mg/dL
Hgb urine dipstick: NEGATIVE
Ketones, ur: NEGATIVE mg/dL
Nitrite: NEGATIVE
Protein, ur: NEGATIVE mg/dL
Specific Gravity, Urine: 1.008 (ref 1.005–1.030)
pH: 7 (ref 5.0–8.0)

## 2024-01-28 MED ORDER — ACETAMINOPHEN 500 MG PO TABS
1000.0000 mg | ORAL_TABLET | Freq: Once | ORAL | Status: AC
  Administered 2024-01-28: 1000 mg via ORAL

## 2024-01-28 MED ORDER — ACETAMINOPHEN 500 MG PO TABS
1000.0000 mg | ORAL_TABLET | Freq: Once | ORAL | Status: DC
  Filled 2024-01-28: qty 2

## 2024-01-28 NOTE — MAU Provider Note (Addendum)
 History     CSN: [id number]  Arrival date and time: 01/28/24 9368   Event Date/Time   First Provider Initiated Contact with Patient 01/28/24 [phone]      Chief Complaint  Patient presents with   Abdominal Pain   Dysuria   Kambre Messner , a  23 y.o. G2P0010 at [redacted]w[redacted]d presents to MAU with complaints of lower abdominal pain. Patient states that this morning at 0400 she started having lower abdominal pain that was sharp and constant. Currently rates pain as a 6/10. Denies attempting to relieve symptoms. She states that at that time she also noticed pain with urination. She denies it being a burning sensation but more of a pressure discomfort. Denies blood in her urine. She endorses some lower back pain but denies reports that is not a new problem. Denies abnormal vaginal discharge, and vaginal bleeding.    OB History     Gravida  2   Para  0   Term  0   Preterm  0   AB  1   Living  0      SAB  1   IAB  0   Ectopic  0   Multiple  0   Live Births  0           Past Medical History:  Diagnosis Date   ADD (attention deficit disorder)    Anxiety    Depression    GERD (gastroesophageal reflux disease)    Migraine without aura and without status migrainosus, not intractable 11/07/2021   Vitamin D  deficiency     No past surgical history on file.  Family History  Problem Relation Age of Onset   Depression Mother    High blood pressure Mother    Thyroid disease Mother    Depression Father    Diabetes Father    High blood pressure Father    Anxiety disorder Father    Drug abuse Father    Depression Maternal Grandmother     Social History   Tobacco Use   Smoking status: Never    Passive exposure: Never   Smokeless tobacco: Never  Vaping Use   Vaping status: Never Used  Substance Use Topics   Alcohol use: Never   Drug use: Never    Allergies: No Known Allergies  Medications Prior to Admission  Medication Sig Dispense Refill Last Dose/Taking    aspirin  EC 81 MG tablet Take 1 tablet (81 mg total) by mouth at bedtime. Start taking when you are [redacted] weeks pregnant for rest of pregnancy for prevention of preeclampsia 300 tablet 2 01/27/2024   Prenatal Vit-Fe Fumarate-FA (PRENATAL PO) Take by mouth daily.   01/27/2024   Accu-Chek Softclix Lancets lancets 1 each by Other route as needed for other. Check blood sugar when feeling dizzy or lightheaded. 100 each 0    Blood Glucose Monitoring Suppl (ACCU-CHEK GUIDE) w/Device KIT 1 Device by Does not apply route as needed. Check blood sugar when feeling dizzy or lightheaded. 1 kit 0    glucose blood test strip Check blood sugar when feeling dizzy or lightheaded. 100 each 0    Vitamin D , Ergocalciferol , (DRISDOL ) 1.25 MG (50000 UNIT) CAPS capsule Take 1 capsule (50,000 Units total) by mouth every 7 (seven) days. 12 capsule 2     Review of Systems  Constitutional:  Negative for chills, fatigue and fever.  Eyes:  Negative for pain and visual disturbance.  Respiratory:  Negative for apnea, shortness of breath and wheezing.  Cardiovascular:  Negative for chest pain and palpitations.  Gastrointestinal:  Positive for abdominal pain. Negative for constipation, diarrhea, nausea and vomiting.  Genitourinary:  Negative for difficulty urinating, dysuria, pelvic pain, vaginal bleeding, vaginal discharge and vaginal pain.  Musculoskeletal:  Negative for back pain.  Neurological:  Negative for seizures, weakness and headaches.  Psychiatric/Behavioral:  Negative for suicidal ideas.    Physical Exam   Blood pressure 127/73, pulse 88, temperature 98.2 F (36.8 C), temperature source Oral, resp. rate 16, height 5' 5 (1.651 m), weight 90.4 kg, last menstrual period 10/01/2023, SpO2 100%.  Physical Exam Vitals and nursing note reviewed.  Constitutional:      General: She is not in acute distress.    Appearance: Normal appearance.  HENT:     Head: Normocephalic.  Pulmonary:     Effort: Pulmonary effort is  normal.  Abdominal:     Palpations: Abdomen is soft.     Tenderness: There is abdominal tenderness in the right lower quadrant and periumbilical area. There is guarding.  Musculoskeletal:     Cervical back: Normal range of motion.  Skin:    General: Skin is warm and dry.  Neurological:     Mental Status: She is alert and oriented to person, place, and time.  Psychiatric:        Mood and Affect: Mood normal.    FHT obtained in triage  MAU Course  Procedures Orders Placed This Encounter  Procedures   Wet prep, genital   Urinalysis, Routine w reflex microscopic -Urine, Clean Catch   CBC with Differential/Platelet   Comprehensive metabolic panel   Apply heat to affected area   Meds ordered this encounter  Medications   DISCONTD: acetaminophen  (TYLENOL ) tablet 1,000 mg   acetaminophen  (TYLENOL ) tablet 1,000 mg    MDM - PO Tylenol  ordered and heat applied.  - UTI vs Appendicitis.   - Transfer of care to R. Letha CNM @ [address] (Claris) Emilio, MSN, CNM  [facility]  01/28/2024 8:18 AM    Assessment and Plan  1. Abdominal pain during pregnancy in second trimester (Primary) - Information provided on abdominal pain in pregnancy   2. Round ligament pain - Information provided on RLP   3. [redacted] weeks gestation of pregnancy  - Discharge home - Keep scheduled appt with [facility] on 02/20/2024 - Patient verbalized an understanding of the plan of care and agrees.  Jaine Estabrooks, CNM 01/28/2024 10:15 AM

## 2024-01-28 NOTE — MAU Note (Signed)
..  Anita Caldwell is a 23 y.o. at [redacted]w[redacted]d here in MAU reporting: constant stabbing lower abdominal pain in the middle of the night. Reports pressure when voiding this morning.  Denies vaginal bleeding or leaking of fluid or abnormal vaginal discharge.   Pain score: 6/10 Vitals:   01/28/24 0658  BP: 127/73  Pulse: 88  Resp: 16  Temp: 98.2 F (36.8 C)  SpO2: 100%     FHT: 150 Lab orders placed from triage:  UA

## 2024-01-29 LAB — CULTURE, OB URINE

## 2024-01-29 LAB — GC/CHLAMYDIA PROBE AMP (~~LOC~~) NOT AT ARMC
Chlamydia: NEGATIVE
Comment: NEGATIVE
Comment: NORMAL
Neisseria Gonorrhea: NEGATIVE

## 2024-02-11 ENCOUNTER — Telehealth: Payer: Self-pay | Admitting: *Deleted

## 2024-02-11 NOTE — Telephone Encounter (Signed)
 Patient given safe meds that she can take off of safe med list for 2nd trimester for a could and nose congestion. Patient has misplaced list, please send her another list through MyChart if possible. Thank you.

## 2024-02-12 ENCOUNTER — Other Ambulatory Visit: Payer: Self-pay | Admitting: *Deleted

## 2024-02-12 ENCOUNTER — Ambulatory Visit: Attending: Obstetrics and Gynecology | Admitting: Obstetrics and Gynecology

## 2024-02-12 ENCOUNTER — Other Ambulatory Visit

## 2024-02-12 ENCOUNTER — Ambulatory Visit

## 2024-02-12 VITALS — BP 129/68

## 2024-02-12 DIAGNOSIS — O358XX0 Maternal care for other (suspected) fetal abnormality and damage, not applicable or unspecified: Secondary | ICD-10-CM

## 2024-02-12 DIAGNOSIS — Z3A19 19 weeks gestation of pregnancy: Secondary | ICD-10-CM

## 2024-02-12 DIAGNOSIS — Z3A10 10 weeks gestation of pregnancy: Secondary | ICD-10-CM

## 2024-02-12 DIAGNOSIS — Z36 Encounter for antenatal screening for chromosomal anomalies: Secondary | ICD-10-CM | POA: Diagnosis present

## 2024-02-12 DIAGNOSIS — Z3401 Encounter for supervision of normal first pregnancy, first trimester: Secondary | ICD-10-CM | POA: Insufficient documentation

## 2024-02-12 NOTE — Progress Notes (Signed)
 After review, MFM consult with provider is not indicated for today  Arna Ranks, MD 02/12/2024 5:25 PM  [facility]

## 2024-02-13 ENCOUNTER — Ambulatory Visit

## 2024-02-13 ENCOUNTER — Other Ambulatory Visit

## 2024-02-20 ENCOUNTER — Ambulatory Visit: Admitting: Obstetrics and Gynecology

## 2024-02-20 VITALS — BP 123/72 | HR 88 | Wt 204.0 lb

## 2024-02-20 DIAGNOSIS — Z6831 Body mass index (BMI) 31.0-31.9, adult: Secondary | ICD-10-CM

## 2024-02-20 DIAGNOSIS — F411 Generalized anxiety disorder: Secondary | ICD-10-CM

## 2024-02-20 DIAGNOSIS — Z3A20 20 weeks gestation of pregnancy: Secondary | ICD-10-CM

## 2024-02-20 DIAGNOSIS — F988 Other specified behavioral and emotional disorders with onset usually occurring in childhood and adolescence: Secondary | ICD-10-CM | POA: Diagnosis not present

## 2024-02-20 DIAGNOSIS — Z3402 Encounter for supervision of normal first pregnancy, second trimester: Secondary | ICD-10-CM | POA: Diagnosis not present

## 2024-02-20 NOTE — Progress Notes (Signed)
° °  PRENATAL VISIT NOTE  Subjective:  Anita Caldwell is a 23 y.o. G2P0010 at [redacted]w[redacted]d being seen today for ongoing prenatal care.  She is currently monitored for the following issues for this low-risk pregnancy and has BMI 31.0-31.9,adult; Vitamin D  deficiency; Attention deficit disorder (ADD) in adult; Generalized anxiety disorder; and Supervision of normal first pregnancy on their problem list.  Patient reports doing well overall, would like to discuss weight gain and eating healthy in pregnancy.  Contractions: Not present. Vag. Bleeding: None.  Movement: Present. Denies leaking of fluid.   The following portions of the patient's history were reviewed and updated as appropriate: allergies, current medications, past family history, past medical history, past social history, past surgical history and problem list.   Objective:   Vitals:   02/20/24 1538  BP: 123/72  Pulse: 88  Weight: 204 lb (92.5 kg)    Fetal Status: Fetal Heart Rate (bpm): 150   Movement: Present     General:  Alert, oriented and cooperative. Patient is in no acute distress.  Skin: Skin is warm and dry. No rash noted.   Cardiovascular: Normal heart rate noted  Respiratory: Normal respiratory effort, no problems with respiration noted  Abdomen: Soft, gravid, appropriate for gestational age.  Pain/Pressure: Absent      Assessment and Plan:  Pregnancy: G2P0010 at [redacted]w[redacted]d 1. Encounter for supervision of normal first pregnancy in second trimester (Primary) 2. [redacted] weeks gestation of pregnancy Anatomy normal but incomplete, follow up scheduled 1/13 Discussed pregnancy weight gain, nutrition and exercise  3. Generalized anxiety disorder 4. Attention deficit disorder (ADD) in adult No current medications, no complaints today  5. BMI 31.0-31.9,adult ldASA  Please refer to After Visit Summary for other counseling recommendations.   Return in about 4 weeks (around 03/19/2024) for return OB at 24 weeks.  Future Appointments   Date Time Provider Department Center  03/18/2024  2:15 PM [facility] PROVIDER 1 [facility]  03/18/2024  2:30 PM [facility] US1 [facility]  03/19/2024  3:30 PM Nicholaus Burnard HERO, MD [facility]   Kieth JAYSON Carolin, MD

## 2024-03-18 ENCOUNTER — Encounter: Payer: Self-pay | Admitting: Obstetrics and Gynecology

## 2024-03-18 ENCOUNTER — Ambulatory Visit: Attending: Obstetrics and Gynecology | Admitting: Maternal & Fetal Medicine

## 2024-03-18 ENCOUNTER — Ambulatory Visit

## 2024-03-18 VITALS — BP 125/55 | HR 83

## 2024-03-18 DIAGNOSIS — Z3403 Encounter for supervision of normal first pregnancy, third trimester: Secondary | ICD-10-CM

## 2024-03-18 DIAGNOSIS — O358XX0 Maternal care for other (suspected) fetal abnormality and damage, not applicable or unspecified: Secondary | ICD-10-CM

## 2024-03-18 DIAGNOSIS — Z362 Encounter for other antenatal screening follow-up: Secondary | ICD-10-CM

## 2024-03-18 DIAGNOSIS — Z3A24 24 weeks gestation of pregnancy: Secondary | ICD-10-CM | POA: Insufficient documentation

## 2024-03-18 DIAGNOSIS — Z3402 Encounter for supervision of normal first pregnancy, second trimester: Secondary | ICD-10-CM

## 2024-03-18 DIAGNOSIS — O99210 Obesity complicating pregnancy, unspecified trimester: Secondary | ICD-10-CM

## 2024-03-18 NOTE — Progress Notes (Signed)
" ° °  Patient information  Patient Name: Anita Caldwell  Patient MRN:   [id number]  Referring practice: MFM Referring Provider: [facility]  Problem List   Patient Active Problem List   Diagnosis Date Noted   Supervision of normal first pregnancy 12/12/2023   Generalized anxiety disorder 08/27/2022   Attention deficit disorder (ADD) in adult 09/14/2021   Vitamin D  deficiency 04/18/2021   BMI 31.0-31.9,adult 03/13/2021    Maternal Fetal medicine Consult  Anita Caldwell is a 24 y.o. G2P0010 at [redacted]w[redacted]d here for ultrasound and consultation. Anita Caldwell is doing well today with no acute concerns. Today we focused on the following:   The patient is here for a follow-up growth ultrasound due to previously incomplete fetal anatomy. I discussed that the fetal anatomy appears normal on todays examination. The estimated fetal weight is stable at the 80th percentile. We reviewed the importance of maintaining a healthy diet and regular exercise during pregnancy to support appropriate weight gain. I also discussed the need to complete routine glucose screening at [redacted] weeks gestation, or earlier if clinically indicated. She will continue routine prenatal care with her OB provider. At this time, there are no indications for additional growth ultrasounds or further testing; however, she may be referred back to MFM if new indications arise.  RECOMMENDATIONS -Encouraged healthy diet and regular exercise during pregnancy -Complete routine glucose screening at 28 weeks or earlier if indicated -No additional ultrasounds or testing indicated at this time -Refer back to MFM if new clinical indications arise  The patient had time to ask questions that were answered to her satisfaction.  She verbalized understanding and agrees to proceed with the plan above.   I spent 20 minutes reviewing the patients chart, including labs and images as well as counseling the patient about her medical conditions.  Greater than 50% of the time was spent in direct face-to-face patient counseling.  Delora Smaller  MFM, [place]   03/18/2024  3:13 PM   Review of Systems: A review of systems was performed and was negative except per HPI   Vitals and Physical Exam    03/18/2024    1:56 PM 02/20/2024    3:38 PM 02/12/2024    1:59 PM  Vitals with BMI  Weight  204 lbs   BMI  33.95   Systolic 125 123 870  Diastolic 55 72 68  Pulse 83 88     Sitting comfortably on the sonogram table Nonlabored breathing Normal rate and rhythm Abdomen is nontender  Past pregnancies OB History  Gravida Para Term Preterm AB Living  2 0 0 0 1 0  SAB IAB Ectopic Multiple Live Births  1 0 0 0 0    # Outcome Date GA Lbr Len/2nd Weight Sex Type Anes PTL Lv  2 Current           1 SAB 05/2022             Future Appointments  Date Time Provider Department Center  03/19/2024  3:30 PM Nicholaus Burnard HERO, MD [facility]  04/18/2024  8:50 AM Regino, Camie LABOR, CNM [facility]      "

## 2024-03-19 ENCOUNTER — Ambulatory Visit: Admitting: Obstetrics and Gynecology

## 2024-03-19 ENCOUNTER — Encounter: Payer: Self-pay | Admitting: Obstetrics and Gynecology

## 2024-03-19 VITALS — Wt 214.0 lb

## 2024-03-19 DIAGNOSIS — Z3A24 24 weeks gestation of pregnancy: Secondary | ICD-10-CM

## 2024-03-19 DIAGNOSIS — Z6835 Body mass index (BMI) 35.0-35.9, adult: Secondary | ICD-10-CM

## 2024-03-19 DIAGNOSIS — Z3402 Encounter for supervision of normal first pregnancy, second trimester: Secondary | ICD-10-CM

## 2024-03-19 NOTE — Patient Instructions (Signed)
 This website provides an overview of breastfeeding: [email or web address].[email or web address]  Have you thought about breastfeeding? See the links below for information and links to sign up for classes (in-person, virtual, and self-paced) through [facility] Health!  Howdangerous.be  See below for link to the [employer] webpage on breastfeeding. Https://www.healthychildren.org/English/ages-stages/baby/breastfeeding/Pages/default.aspx  See below for link to the [employer] on breastfeeding. Skateboardingcourses.com.cy    [facility] Pediatric Providers  Central/Southeast [place] ([address]) [facility] Delores, MD; Jeanelle, MD; Anders, MD; Scarlet, MD; McDiarmid, MD; Debborah, MD [address] [place] [place], [place] [address] [phone] Mon-Fri 8:30-12:30, 1:30-5:00  Providers come to see babies during newborn hospitalization Only accepting infants of Mothers who are seen at [facility] or have siblings seen at   [address] [facility] Medicaid - Yes; Tricare - Yes   [facility] [place], MD [address], [place], [place] [address] [phone] Mon, Tue, Thur, Fri 8:30-5:00, Wed 10:00-7:00 (closed 1-2pm daily for lunch) [facility] residents with no insurance.  [place] [employer] only with Medicaid/insurance; Tricare - no  [facility]) - Tim and [facility], MD; Delores, MD; Dozier, MD; Artice, MD; Lorrene, MD; Kenney, MD; Azell, MD; Joshua,  MD; Gretel, MD; Leta, MD; Herminio, MD; Gabriella, MD; Taft, MD; Teodora, NP [address], [place], [place] [address] [phone] Mon, Tue, Thur, Fri 8:30-5:30, Wed 9:30-5:30, Sat 8:30-12:30 Only accepting infants of first-time parents or siblings of current patients Hospital discharge  coordinator will make follow-up appointment Medicaid - yes; Tricare - yes  [place] [phone]) [facility] Free, MD; Nicholaus, MD; Dennise, MD; Valdemar, MD; Marget, MD; [facility], MD; Hunter, MD; Arlys, MD; Trudy, MD [address], [place], [place] [address] [phone] Mon-Fri 8:30-5:00, closed for lunch 12:30-1:30; Sat-Sun 10:00-1:00 Accepting Newborns with commercial insurance only, must call prior to delivery to be accepted into  practice.  Medicaid - no, Tricare - yes   [facility] [address] [place], [place] [address] [phone] or [phone] Mon to Fri 8am to 10pm, Sat 8am to 1pm (virtual only on weekends) Only accepts Medicaid Healthy Blue pts  [facility] ([facility]) - Pediatrics at Anna Florence, MD; Doreene, MD; Charlane Idol, MD; Davia, NP; Florie, MD; Audelia NELS Barrio, MD [address] [place], [place] [address] [phone] Mon-Fri 8:30-5:30 Medicaid - yes, Tricare - yes  [place] [phone]) [facility] Donovan, MD [address], [place], [place] [address] [phone] Pablo Bodily, Wed Fri 8:30-5:00, Sat 8:30-12:00, Closed Thursdays Accepting siblings of established patients and first time moms if you call prenatally Medicaid- yes; Tricare - yes  [facility] Signa [place], [place]; Rolinda, MD; Richarda RIGGERS; Scifres, PA; Austin, MD; Seabron, MD;  [address] [place], [place] [address] [phone] Mon-Fri 8:30-5:00, closed for lunch 1-2 Only accepting newborns of established patients Medicaid- no; Tricare - yes  [facility] [phone]) [facility] Herlene Rotunda, MD; [address], [place], [place] [address] [phone] Mon-Fri 8:00-5:00 Medicaid - No; Tricare - Yes  [facility] [place], [place]; [place], [place] [address] [place], [place] [address] [phone] Mon-Fri  8:00-5:00 Medicaid - No, Tricare - Yes  [facility] Selma, MD; Ty, MD; [place], [place] [address] [place] [place], [place] [address] [phone]  Mon-Fri 8:00-5:00 Medicaid - No; Tricare - [place]  [facility] Pediatrics [address], [place], [place]  [address] [phone] Mon-Fri 8:30-5:00 (lunch 12:00-1:00) Medicaid -Yes; Tricare - Yes  [facility] Jordan, MD [address] [place], [place], [place] [address] [phone] Mon-Fri 8:00-5:00 Seeing newborns of current patients only. No new patients Medicaid - No, Tricare - yes  Nature Conservation Officer at [place] [address] MD [address] [place], [place] [address] [phone] Mon-Fri 8:00-5:00 Medicaid -yes as secondary coverage only; Tricare - yes  [facility], [place]; [place], [place]; Sybil, MD; Zelpha, MD; Dozier, MD; [place], [place]; Smoot, NP; Madalyn, MD; [place], MD [address] [place], [place] [address] [phone] Mon-Fri 8:30-5:00, Sat 9:00-11:00 Accepts commercial insurance ONLY. Offers free prenatal information sessions for families. Medicaid - No, Tricare - Call first  [facility] [place], MD; [place], [place]; [place], [place]; [place], [place] [address] [place] [address] [phone] Mon-Fri 7:30-5:30 Medicaid - Yes; Normie JASMINE yes  [place] [phone] & [phone])  [facility], MD [address] [place], [place] [address] [phone] Mon-Thur 8:00-6:00, closed for lunch 12-2, closed Fridays Medicaid - yes; Tricare - no  [facility] Lenon, NP; Sophronia, MD; [place], [place]; [place], [place] [address], [place], [place] [address] [phone] Mon-Fri 7:30-4:30 Medicaid - yes, Tricare - yes  [facility]  Birdie, MD; Belenda, NP; Nicholas, MD; Donnamae, NP [address], [place], [place] [address] [phone] Mon-Fri 8:30-5:00, closed for lunch  1-2, Sat 8:30-12:00 - sick visits only Providers come to see babies at [facility] Only accepting newborns of siblings and first time parents ONLY if who have met with office prior to delivery Medicaid -Yes; Tricare - yes  [facility] - [place], [place]; Wanetta, NP; Prentiss, MD; Debarah, MD:  [address], [place], [place] [address] [phone] Mon- Fri 8:00-5:00, Sat 9:00-12:00 - sick visits only Accepting siblings of established patients and first time mom/baby Medicaid - Yes; Tricare - yes Patients must have vaccinations (baby vaccines)  [place]west [place] [phone] & [phone])  Adult Nurse HealthCare at [facility] [address] [place], [place] [address] [phone] Mon-Fri 8:00-5:00 Medicaid - no; Tricare - yes  [facility] [place], MD; [place], [place]; [place], [place] [address], [place], [place] [address] [phone] Mon-Fri 8:00-5:00 Medicaid- yes; Tricare - yes  [facility] - Myra Verita Brought, MD; Joshua, NP; [place], [place] [address] [place], [place], [place] [address] [phone] Mon-Fri 8:00-5:00 Medicaid - Yes; Tricare - yes  [address] [place] [phone])  [facility] [place], [place]; [place], [place]; Tommas, MD; Armond, MD; [place]wy[place], NP; Isenhour, DO; [place], [place]; Chandra, MD; Layla, MD; Orlando, MD; [place], [place]; [place], [place]; [place], [place] [address] [facility] [address], [place], [place] [address] [phone] Mon-Fri 8:30-5:00, Sat 9:00-12:00 - sick only Please register online [email or web address] then schedule online or call office Medicaid-Yes; Tricare -yes  [facility] - Premier  Dabrusco, MD; Joaquim, MD; [place], MD; Lebam, NP; [place], [place]; Charlyne, MD; Shlomo, NP; Devora, MD [address], [place], [place] [address] [phone] Mon-Fri 8:00-5:30, Sat&Sun by appointment (phones open at  8:30) Medicaid - Yes; Tricare - yes  [place] [phone] & [phone]) [facility] Pediatrics Dasie FINER; Carlie, MD; Vaughn, MD; Moishe, NP; [place], DO [address], [place], [place] [address] [phone] M-F 8:00 - 5:15, Sat/Sun 9-12 sick visits only Medicaid - No; Tricare - yes  [facility]  [address] [facility] [place] [facility]  [place], PA-C; [place], PA-C; [place], DO; [place], PA-C; [place], PA-C; Theo NELS [place], MD [address], [place], [place] [address] [phone] Mon-Thur 8:00-7:00, Fri 8:00-5:00 Accepting Medicaid for 13 and under only   [facility] - Family Medicine at [place] ([facility] [place]) [place], [place]; List, FNP; Myrick, MD; Fayetta, PA-C; Kari, MD; Kathy, FNP; Nzenwa, FNP; Montgomery, MD; Myrick, MD [phone] [address], [place], [place] [address] [phone] Mon-Fri 8:30-5:30 Medicaid - Yes; Tricare - yes  [facility]s - [address]  [place], [place]; Leontine, MD; Atilano, MD; Dessa, MD; [place], NP [address], [place], [place] [address] [phone] Mon-Thur 8:00-5:30, Fri 8:00-5:00, Sat 9:00-12:00 Medicaid - yes, Tricare - yes  [place] [phone])  [facility], [place]; Nanci, MD; [place], [place] [address], [place], [place] [address] [phone] Mon-Fri 8:00-5:00, closed for lunch 12-1 Medicaid - No; Tricare - yes  Nature Conservation Officer at [facility], MD [address] [place], [place] [address] [phone] Mon-Fri 8:00-5:00 Medicaid - No; Tricare - yes  [facility] - [facility] Brenes), MD; Eber, MD; Livingston, MD; Joshua, MD [address], [place], [place] [address] [phone] Mon-Fri 8:00-5:00 Medicaid- Yes; Tricare - yes  [place] [phone] Engineer, Contracting HealthCare at [facility], [place]; Oblong, MD [address], [place], [place] [address] [phone] Mon-Fri 8:00-5:00 Medicaid -  No; Tricare - yes  [facility] - Karenann Kin - CPNP [address] [place], [place], [place] [address] [phone] Mon-Weds 8:00-6:00, Thurs-Fri 8:00-5:00, Sat 9:00-12:00 Medicaid - yes; Tricare - yes   [facility] Karenann Moores, MD; [place], [place] [address] [place], [place] [address] [phone] Mon-Fri 8:00-5:00 Medicaid - yes; Tricare - yes  [facility] Pediatric Providers  [facility] [address], [place], [place] [address] [phone] CHRISTELLA Cagey: 8am -8pm, Tues, Weds: 8am - 5pm; Fri: 8-1 Medicaid - Yes; Tricare - yes  [facility] Dariel, MD; Vicci, MD; Malvina, MD; [place], [place]; [place], [place] [address] [place], [place] [address] [phone] M-F 8:30 - 5:00 Medicaid - Call office; Tricare -yes  [facility] Devora Hash, MD; Jenelle, MD, Marny, MD; Gustabo, PNP; Wenceslao, NP [phone] [address] [place], [place] [address] [phone] M-F 8:30 - 5:00, Sat/Sun 8:30 - 12:30 (sick visits) Medicaid - Call office; Tricare -yes  [facility] Ezzard, MD; Bard, PNP; Enoch, MD; [place], [place]; [place], NP; Rozelle FINER [address], [place], [place] [address] [phone] M-F 8:30 - 5:00 Medicaid - Call office; Tricare - yes  [facility] Rozell Rosella, MD; Clarance, MD; Dionisio, MD; Nelda, MD; Nogo, MD [phone] S[address] [place], [place] [address] [phone] M-Thur: 8:00 - 5:00; Fri: 8:00 - 4:00 Medicaid - yes; Tricare - yes  [facility] [address] [place], [place] [address] [phone] M-F: 8:30- 5:00, closed for lunch 12:30 - 1:00 Medicaid - yes; Tricare -yes  [facility] [address] [place], [place] [address] [phone] M-F 8:00 - 5:00 Medicaid - yes; Tricare - yes  [facility], DO; [place], DO; [place], NP [address], [place], [place] [address] [phone] M-F  8:00 - 5:00, Closed 12-1 for lunch Medicaid - Call; Tricare - yes  [facility] - Pediatrics Loris, MD [address], [place], [place] [address] [phone] M-F: 8:00-5:00, Sat: 8:00 - noon Medicaid - call; Tricare -yes  [facility] Pediatric Providers  [facility] [place], [place] [address] [place], [place] [address] [phone] M-W: 8:00-5:00, Thur: 8:00 - 7:00, Fri: 8:00 - noon Medicaid - yes; Tricare - yes  [facility] L[facility] Clause, FNP [address], [place], [place] [address] [phone] M-F 8:00 - 5:00, Closed for lunch 12-1 Medicaid - yes; Tricare - yes  [facility] Pediatric Providers  [facility] at [place], [place], Seena, MD, Kemmerer, FNP-C [address] [facility], [address], [place], [place] [address] [phone] M-T 8:00-5:00, Wed-Fri 7:00-6:00 Medicaid - Yes; Tricare -yes  [facility] Family Medicine at [facility], DO; [address] [place], [place] [address] [phone] M-F 8:00 - 5:00, closed for lunch 12-1 Medicaid - Yes; Tricare - yes  [facility] Pediatrics and Internal Medicine  Gwenn, MD; Relda, MD; Merleen, MD; Karn, MD; Charlyn, MD; Reeda, MD; Warren, MD, Elizabeth, MD; Krystal, MD; Simon, MD; Darral, MD; Debarah, MD [address], [place], [place] [address] [phone] M-F 8:00-5:00 Medicaid - yes; Tricare - yes  [facility], MD (speaks Punjabi and Hindi) [address] [place], [place] [address] [phone] M-F: 8:30 - 5:00, closed 12:30 - 1 for lunch Medicaid - Yes; Tricare -yes  [facility] Pediatric Providers  [facility] Delford, MD; Chrystal, MD; Dann, MD [address], [place], [place] [address] [phone] M-Th: 8:00 - 5:30, Fri: 8:00 - 12:00 Medicaid - yes; Tricare - yes  [facility]s at [facility], NP; Gladis,  MD; Dian, MD [phone] [address], [place], [place] [address] [phone] M-F: 8:00 - 5:00 Medicaid - yes; Tricare - yes  [facility] Higgston, NP; Effie, NP; Renato, NP; Driscilla, MD; Trudy, MD, [place], NP, Edsel, MD; Tobie HAS [address] [place], [place] [address] [phone] M-F: 8:30 - 5:30p Medicaid - yes; Tricare - yes Other locations available as well  [facility], MD; Tanda, MD; Joesph DEVONNA Archie NELS Billy, PA-C [address], [place], [place] [address] [phone] M-W: 8:00am - 7:00pm, Thurs: 8:00am - 8:00pm; Fri: 8:00am - 5:00pm, closed daily from 12-1 for lunch Medicaid - yes; Tricare - yes  [facility] Pediatric Providers  [facility] at Lurlene Clause, MD; Camelia, FNP; Ruther, MD; Christiana, MD; [place], PNP; Nola DEVONNA; [place], [place]; Lonzell, MD;  [address], [place], [place] [address] [phone] CHRISTELLA - [place]: 8am - 5pm, Sat 9-noon Medicaid - Yes; Tricare -yes  [facility] at Graciela Chain, MD; Joshua, FNP; Silva, MD; [place], MD [address] [place] MARR[place], [place] [phone] M-F 8:00 - 5:00 Medicaid - call; Tricare - yes  [facility]- Loraine Edison, MD; [place], [place]; Climmie, MD; Lenon, MD; Sharie NELS Louder, MD; Murphy, MD; Blane NODAL; Lenard, MD; Curly, MD; [place], MD [address], [place], [place] [address] [phone] M-F 8:00am - 5:00pm; Sat. 9:00 - 11:00 Medicaid - yes; Tricare - yes  [facility] at [facility], MD [address], [place], [place] [address] [phone] M-F 8:00 - 5:00 Medicaid - [place] Medicaid only; Tricare - yes  [facility]s - Mccoy Finder, MD; Nicholaus, [place]; Murphy, MD [address] [place], [place] [address] [phone] M-F 8:00 - 5:00 Medicaid - yes; Tricare - yes  [facility]s -  Kennedy Dames, MD; Delores, MD, [facility], MD, [place], MD; Rhame, MD; Claudene, MD; [address] Christianna [place], [place] [address] [phone] M-F: 8-5 Medicaid - yes; Tricare - yes  [facility] - Valli Cleaves, [place]; [place], MD; [address] [place], [place] [address] [phone] M-F 8-5 Medicaid - yes; Tricare - yes  [facility] Micheline GLENWOOD Pellant, MD; Eber, MD; Sold[place]-Courture, MD; Pellam-Palmer, DNP; [place], PNP [address], [place], [place] [address] [phone] M-F 8-5 Medicaid - yes; Tricare - yes  [facility] Internal Medicine and Pediatrics Jennifer, MD; Gregorio RIGGERS; Karole Moots, MD [address], [place], [place] [address] [phone] M-F 7am - 5 pm Medicaid - call; Tricare - yes  [facility], [place]; Rod, MD; Lang, MD [address] [place], [place] [address] [phone] M-F 8-5 Medicaid - yes; Tricare - yes  [facility] Marylouise, MD; Rory, MD; Trudy, FNP; Burnetta, FNP; Vida, FNP; Elena RIGGERS; [facility] - FNP [address] [place], [place] [address] [phone] M-F 8-5 Medicaid- yes; Tricare - yes  [facility] - Primus Pesa, Lively and Jeannetta Phenix, MD; Gaye, MD; Willistine, MD; Jackquline, MD; [place], [place]; Primus, MD; Rod, MD; Jeannetta, MD [address], [place], [place] [address] [phone] M-F: 8-5, Sat: 9-4, Sun 9-12 Medicaid - yes; Tricare - yes  [facility] Little, PNP; Nicholaus, PNP [address] Christianna Fonder [place], [place] [address] [phone] M-F 8 - 5, closed 12-1 for lunch Medicaid - yes; Tricare - yes  [facility] Pediatrics Kellie, MD; Bradley, MD; Jeannetta, MD; Rutland, DO [address] [place], [place] [address] [phone] M-F 8- 5:30 Medicaid - yes; Tricare - yes  [facility] Pediatrics - Valli Fielding, MD; Levander, MD; Regena, MD [address], [place], [place] [address] [phone] CHRISTELLA: [place]; Tues-Fri: 8-5; Sat: 9-12 Medicaid - yes; Tricare - yes  [facility] Pediatrics - Lurlene Pfeiffer, MD; Gretel, MD; Gretta, MD; Modesto, MD; Rawland, MD [address] [place], [place] [address] [phone] CHRISTELLABETHA MONROEMERL Norrie: 8-5; Sat: 8:30-12:30 Medicaid - yes; Tricare - yes  [facility] Pediatrics - Fonder Rhett Billing, MD; [place], [place] [address] CHARLENA [address] [place], [place] [address] [phone] Mon-Fri: 8-5 Medicaid - yes; Tricare - yes  [facility] Pediatrics - [place] Shell, CPNP; [place], [place]; Jeannetta, MD; Linton, MD; Ambrose, MD; [address] [place] [address] [place], [place] [address] [phone] M-F: 8-5, closed 1-2 for lunch Medicaid - yes; Tricare - yes  [facility] Pediatrics - [facility] [place], [place]; [place], [place]; Claudene, MD; Jordan, CPNP; [place], [place]; [place], MD; Prentiss, MD [address], [place], [place] [address] [phone] M-Thurs: [place]; Fri: 8-6; Sat: 9-12; Sun 2-4 Medicaid - yes; Tricare - yes  [facility] [address] [facility] Pediatrics - [facility] [place]is Alvin Delores, MD; Lupe, MD; Floy CHOL, FNP; Fronie, DO; [address], [place], [place] [address] [phone] M-F: 8-5 Medicaid - yes; Tricare - yes  [facility] Pediatric Providers  [facility] - Family Medicine -Sherryll Hobby, MD; [place], NP [address], [place], [place] [address] [phone] M - Fri: 8am - 5pm, closed for lunch 12-1 Medicaid - Yes; Tricare - yes  [facility] Dea, MD; Carlo, MD; Sanger, DO; Vinocur, MD;Hall,  PA; [place], [place]; Elaine, NP [phone] [address], [place], [place] [address] [phone] M-F 8:00 -  5:00, Sat 8:00 - 11:30 Medicaid - yes; Tricare - yes  [facility] Fernand, MD; Booth, MD, [address] MD, [place], MD, Charlot Gouin, MD; [place]. [place], NP; [place], [place];  [address] [place], [place] [address] [phone] M-F 8:10am - 5:00pm Medicaid - yes; Tricare - yes  [facility] Adrien, MD; Diablock, NP [address] [place], [place] [address] [phone] M-F 8:00 - 5:00 Medicaid - [place] Medicaid only; Tricare - yes  [facility] Family Medicine - Deep [address] [place], MD; [place], NP [address], [place], [place] [address] [phone] M-F 8:00 - 5:00; Closed for lunch 12 - 1:00 Medicaid - yes; Tricare - yes  [facility] Gayl, MD; [place], FNP [address] [place], [place] [address] [phone] Mon 9-5; Tues/Wed 10-5; Thurs 8:30-5; Fri: 8-12:30 Medicaid - yes; Tricare - yes  [facility] Pediatric Providers  [facility]  Vandergrift, MD; [place], [place] [address], [place], [place] [address] [phone] phone [phone] fax M-F 7:15 - 4:30 Medicaid - yes; Tricare - yes  [place] [facility] Caswell, MD; [place], DO [address] [place], [place] [address] [phone] M-Fri: 8:30 - 5:00, closed for lunch everyday noon - 1pm Medicaid - Yes; Tricare - yes  [facility] Burdine, MD; Toribio, MD; Kayla, MD; Atilano, MD; [place], [place]; Jolee NELS Don, [place]; [place], [place]; [place], [place]; [place], [place] [address] [place], [place] [address] [phone] M-Thurs: 7:30am - 7:00pm; Friday 7:30am - 4pm; Sat: 8:00 - 1:00 Medicaid - Yes; Tricare - yes  [place] [facility] [place] Moulding, MD; Rendell, MD; Lord, MD; Oriska, DO [address], [place], [place] [address] [phone] M-Thur: 8:00 - 5:00, Fri: 8:00 - Noon Medicaid - yes; Tricare - yes No [facility] [employer]  [place] [facility] Medicine Dettinger, MD; Jolinda, DO; [place],  NP; Gladis, NP; Joesph, NP; Cathlene, NP; Severa, NP; Zollie, MD; [place], [place] [address] [place], [place] [address] [phone] M-F 8:00 - 5:00 Medicaid - yes; Tricare - yes  [facility], FNP-C; Bucio, FNP-C [address] [place], [place] [address] [phone] M, W, R 8:00-5:00, Tues: 8:00am - 7:00pm; Fri 8:00 - noon Medicaid - Yes; Tricare - yes  [facility], MD [address] [place], [place] [address] [phone]  M-Thurs 8:30-5:30, Fri: 8:30-12:30pm Medicaid - Yes; Tricare - N

## 2024-03-19 NOTE — Progress Notes (Signed)
 "  PRENATAL VISIT NOTE  Subjective:  Anita Caldwell is a 24 y.o. G2P0010 at 106w2d being seen today for ongoing prenatal care.  She is currently monitored for the following issues for this low-risk pregnancy and has BMI 31.0-31.9,adult; Vitamin D  deficiency; Attention deficit disorder (ADD) in adult; Generalized anxiety disorder; and Supervision of normal first pregnancy on their problem list.  Patient reports no complaints.  Contractions: Not present. Vag. Bleeding: None.  Movement: Present. Denies leaking of fluid.   The following portions of the patient's history were reviewed and updated as appropriate: allergies, current medications, past family history, past medical history, past social history, past surgical history and problem list.   Objective:   Vitals:   03/19/24 1532  Weight: 214 lb (97.1 kg)    Fetal Status:  Fetal Heart Rate (bpm): 145   Movement: Present    General: Alert, oriented and cooperative. Patient is in no acute distress.  Skin: Skin is warm and dry. No rash noted.   Cardiovascular: Normal heart rate noted  Respiratory: Normal respiratory effort, no problems with respiration noted  Abdomen: Soft, gravid, appropriate for gestational age.  Pain/Pressure: Absent     Pelvic: Cervical exam deferred        Extremities: Normal range of motion.  Edema: None  Mental Status: Normal mood and affect. Normal behavior. Normal judgment and thought content.      12/12/2023    4:35 PM 05/30/2023    3:09 PM 03/26/2023    4:01 PM  Depression screen PHQ 2/9  Decreased Interest 0 0 0  Down, Depressed, Hopeless 0 0 0  PHQ - 2 Score 0 0 0  Altered sleeping 0  0  Tired, decreased energy 0  0  Change in appetite 0  1  Feeling bad or failure about yourself  0  0  Trouble concentrating 0  0  Moving slowly or fidgety/restless 0  0  Suicidal thoughts 0  0  PHQ-9 Score 0   1      Data saved with a previous flowsheet row definition        12/12/2023    4:35 PM 05/30/2023    3:09  PM 03/26/2023    4:01 PM 01/02/2023    3:25 PM  GAD 7 : Generalized Anxiety Score  Nervous, Anxious, on Edge 0 1 0 0  Control/stop worrying 0 1 0 1  Worry too much - different things 0 1 0 1  Trouble relaxing 0 1 0 0  Restless 0 0 0 0  Easily annoyed or irritable 0 0 0 0  Afraid - awful might happen 0 0 0 0  Total GAD 7 Score 0 4 0 2  Anxiety Difficulty    Not difficult at all    Assessment and Plan:  Pregnancy: G2P0010 at [redacted]w[redacted]d  1. Encounter for supervision of normal first pregnancy in second trimester (Primary) Provided anticipatory guidance regarding pediatrician, diet, breastfeeding  2. BMI 35.0-35.9,adult Cont baby aspirin   3. [redacted] weeks gestation of pregnancy   Preterm labor symptoms and general obstetric precautions including but not limited to vaginal bleeding, contractions, leaking of fluid and fetal movement were reviewed in detail with the patient. Please refer to After Visit Summary for other counseling recommendations.   Return in about 1 month (around 04/19/2024) for 2 hr GTT, low OB.  Future Appointments  Date Time Provider Department Center  04/18/2024  8:50 AM Warren-Hill, Camie LABOR, CNM [facility]    Burnard CHRISTELLA Moats, MD  "

## 2024-04-18 ENCOUNTER — Encounter: Admitting: Certified Nurse Midwife

## 2024-04-28 ENCOUNTER — Encounter: Admitting: Obstetrics and Gynecology
